# Patient Record
Sex: Female | Born: 1975 | Race: White | Hispanic: No | Marital: Married | Smoking: Former smoker
Health system: Southern US, Community
[De-identification: ages and names within clinical notes are randomized; demographics above are authoritative.]

## PROBLEM LIST (undated history)

## (undated) DIAGNOSIS — M419 Scoliosis, unspecified: Secondary | ICD-10-CM

## (undated) DIAGNOSIS — D649 Anemia, unspecified: Secondary | ICD-10-CM

## (undated) DIAGNOSIS — T7840XA Allergy, unspecified, initial encounter: Secondary | ICD-10-CM

## (undated) DIAGNOSIS — F419 Anxiety disorder, unspecified: Secondary | ICD-10-CM

## (undated) DIAGNOSIS — F909 Attention-deficit hyperactivity disorder, unspecified type: Secondary | ICD-10-CM

## (undated) DIAGNOSIS — B001 Herpesviral vesicular dermatitis: Secondary | ICD-10-CM

## (undated) DIAGNOSIS — U071 COVID-19: Secondary | ICD-10-CM

## (undated) DIAGNOSIS — R51 Headache: Secondary | ICD-10-CM

## (undated) HISTORY — DX: COVID-19: U07.1

## (undated) HISTORY — PX: APPENDECTOMY: SHX54

## (undated) HISTORY — DX: Headache: R51

## (undated) HISTORY — PX: DILATION AND CURETTAGE OF UTERUS: SHX78

## (undated) HISTORY — PX: TUBAL LIGATION: SHX77

## (undated) HISTORY — DX: Attention-deficit hyperactivity disorder, unspecified type: F90.9

## (undated) HISTORY — DX: Herpesviral vesicular dermatitis: B00.1

## (undated) HISTORY — DX: Allergy, unspecified, initial encounter: T78.40XA

## (undated) HISTORY — DX: Scoliosis, unspecified: M41.9

---

## 2005-05-15 ENCOUNTER — Observation Stay: Payer: Self-pay | Admitting: Obstetrics and Gynecology

## 2005-05-28 ENCOUNTER — Inpatient Hospital Stay: Payer: Self-pay | Admitting: Obstetrics and Gynecology

## 2005-05-28 ENCOUNTER — Observation Stay: Payer: Self-pay | Admitting: Obstetrics and Gynecology

## 2007-08-13 ENCOUNTER — Emergency Department: Payer: Self-pay | Admitting: Emergency Medicine

## 2007-08-15 ENCOUNTER — Emergency Department: Payer: Self-pay | Admitting: Emergency Medicine

## 2008-06-26 ENCOUNTER — Ambulatory Visit: Payer: Self-pay | Admitting: Internal Medicine

## 2009-02-17 HISTORY — PX: OTHER SURGICAL HISTORY: SHX169

## 2009-04-02 ENCOUNTER — Ambulatory Visit: Payer: Self-pay

## 2009-04-03 ENCOUNTER — Ambulatory Visit: Payer: Self-pay

## 2010-03-07 ENCOUNTER — Observation Stay: Payer: Self-pay

## 2010-03-25 LAB — HM PAP SMEAR: HM Pap smear: NORMAL

## 2010-07-15 ENCOUNTER — Ambulatory Visit: Payer: Self-pay

## 2010-07-16 ENCOUNTER — Inpatient Hospital Stay: Payer: Self-pay

## 2010-10-30 ENCOUNTER — Ambulatory Visit: Payer: Self-pay | Admitting: Internal Medicine

## 2010-11-29 ENCOUNTER — Ambulatory Visit: Payer: Self-pay | Admitting: Internal Medicine

## 2010-12-18 ENCOUNTER — Encounter: Payer: Self-pay | Admitting: Internal Medicine

## 2010-12-18 ENCOUNTER — Ambulatory Visit (INDEPENDENT_AMBULATORY_CARE_PROVIDER_SITE_OTHER): Payer: Managed Care, Other (non HMO) | Admitting: Internal Medicine

## 2010-12-18 VITALS — BP 102/76 | HR 86 | Temp 98.0°F | Resp 20 | Ht 66.0 in | Wt 152.0 lb

## 2010-12-18 DIAGNOSIS — F53 Postpartum depression: Secondary | ICD-10-CM

## 2010-12-18 DIAGNOSIS — E663 Overweight: Secondary | ICD-10-CM

## 2010-12-18 DIAGNOSIS — IMO0002 Reserved for concepts with insufficient information to code with codable children: Secondary | ICD-10-CM

## 2010-12-18 MED ORDER — PHENTERMINE HCL 37.5 MG PO CAPS: 37.5000 mg | ORAL_CAPSULE | ORAL | Status: DC

## 2010-12-18 MED ORDER — CITALOPRAM HYDROBROMIDE 10 MG PO TABS: 10.0000 mg | ORAL_TABLET | Freq: Every day | ORAL | Status: DC

## 2010-12-19 DIAGNOSIS — O99345 Other mental disorders complicating the puerperium: Secondary | ICD-10-CM | POA: Insufficient documentation

## 2010-12-19 NOTE — Progress Notes (Signed)
Subjective:    Patient ID: Hannah Mason, female    DOB: [birth year], 35 y.o.   MRN: [id number]  HPI  35 year old female presents to establish care. She notes that 5 months ago she delivered a healthy baby girl. She reports that since that time she has noticed some increased anxiety and finds herself quit to get angry with her family members. She has also had some dysphoric mood, however reports that his recently improved. She notes that her sleep is very limited in caring for her child. She also has 3 other children at home. She has tried to establish working relationship with her husband to help divide chores. She took an online quiz regarding postpartum depression which showed that she does suffer from symptoms consistent with this. She would like to consider starting medication to help improve her mood and coping ability.  She notes that she was recently started by another physician on a stimulant medication to help with appetite suppression. She gained 35 pounds during her pregnancy and has been able to lose about 20 pounds so far. She has been following a healthy diet. Her ability to exercise has been limited by time constraints. She is interested in continuing medication for appetite suppression until she can get to her goal weight of 140 pounds, which was her prepregnancy weight. She denies any symptoms to suggest thyroid dysfunction such as constipation, hot or cold intolerance, dry skin.  Review of Systems  Constitutional: Negative for fever, chills, appetite change, fatigue and unexpected weight change.  HENT: Negative for ear pain, congestion, sore throat, trouble swallowing, neck pain, voice change and sinus pressure.   Eyes: Negative for visual disturbance.  Respiratory: Negative for cough, shortness of breath, wheezing and stridor.   Cardiovascular: Negative for chest pain, palpitations and leg swelling.  Gastrointestinal: Negative for nausea, vomiting, abdominal pain, diarrhea,  constipation, blood in stool, abdominal distention and anal bleeding.  Genitourinary: Negative for dysuria and flank pain.  Musculoskeletal: Negative for myalgias, arthralgias and gait problem.  Skin: Negative for color change and rash.  Neurological: Negative for dizziness and headaches.  Hematological: Negative for adenopathy. Does not bruise/bleed easily.  Psychiatric/Behavioral: Negative for suicidal ideas, sleep disturbance and dysphoric mood. The patient is nervous/anxious.        Objective:   Physical Exam  Constitutional: She is oriented to person, place, and time. She appears well-developed and well-nourished. No distress.  HENT:  Head: Normocephalic and atraumatic.  Right Ear: External ear normal.  Left Ear: External ear normal.  Nose: Nose normal.  Mouth/Throat: Oropharynx is clear and moist. No oropharyngeal exudate.  Eyes: Conjunctivae are normal. Pupils are equal, round, and reactive to light. Right eye exhibits no discharge. Left eye exhibits no discharge. No scleral icterus.  Neck: Normal range of motion. Neck supple. No tracheal deviation present. No thyromegaly present.  Cardiovascular: Normal rate, regular rhythm, normal heart sounds and intact distal pulses.  Exam reveals no gallop and no friction rub.   No murmur heard. Pulmonary/Chest: Effort normal and breath sounds normal. No respiratory distress. She has no wheezes. She has no rales. She exhibits no tenderness.  Abdominal: Soft. Bowel sounds are normal. She exhibits no distension and no mass. There is no tenderness. There is no rebound and no guarding.  Musculoskeletal: Normal range of motion. She exhibits no edema and no tenderness.  Lymphadenopathy:    She has no cervical adenopathy.  Neurological: She is alert and oriented to person, place, and time. No cranial nerve deficit. She exhibits  normal muscle tone. Coordination normal.  Skin: Skin is warm and dry. No rash noted. She is not diaphoretic. No erythema.  No pallor.  Psychiatric: She has a normal mood and affect. Her behavior is normal. Judgment and thought content normal.          Assessment & Plan:  1. Post Partum Depression -will try a course of Celexa. We discussed that this will take several weeks to work. We discussed the potential benefit of improved coping and decreased anxiety and dysphoric mood. She will call if she has any complications at this medication. Otherwise, she will followup in one month.  2. Overweight -patient with BMI at 25. Will continue phentermine for one month with improve efforts at diet and regular physical activity and goal of reaching prepregnancy weight of 140 and normal BMI. We discussed the risk of stimulant medications including increased blood pressure and palpitations. We also discussed the potential risk for increased agitation. She will call or return to clinic should any of these occur. Otherwise she will followup in one month.

## 2011-01-20 ENCOUNTER — Ambulatory Visit (INDEPENDENT_AMBULATORY_CARE_PROVIDER_SITE_OTHER): Payer: Managed Care, Other (non HMO) | Admitting: Internal Medicine

## 2011-01-20 ENCOUNTER — Encounter: Payer: Self-pay | Admitting: Internal Medicine

## 2011-01-20 VITALS — BP 116/62 | HR 85 | Temp 98.4°F | Wt 150.0 lb

## 2011-01-20 DIAGNOSIS — E663 Overweight: Secondary | ICD-10-CM

## 2011-01-20 DIAGNOSIS — F53 Postpartum depression: Secondary | ICD-10-CM

## 2011-01-20 DIAGNOSIS — F3289 Other specified depressive episodes: Secondary | ICD-10-CM

## 2011-01-20 DIAGNOSIS — F329 Major depressive disorder, single episode, unspecified: Secondary | ICD-10-CM

## 2011-01-20 DIAGNOSIS — IMO0002 Reserved for concepts with insufficient information to code with codable children: Secondary | ICD-10-CM

## 2011-01-20 MED ORDER — CITALOPRAM HYDROBROMIDE 10 MG PO TABS: 10.0000 mg | ORAL_TABLET | Freq: Every day | ORAL | Status: DC

## 2011-01-20 NOTE — Patient Instructions (Signed)
[facility] - Book and Blog

## 2011-01-20 NOTE — Progress Notes (Signed)
Subjective:    Patient ID: Hannah Mason, female    DOB: 1976-01-08, 35 y.o.   MRN: [id number]  HPI  35 year old female with history of postpartum depression and being overweight presents for followup. In regards to her depression she reports she has done well. She reports her mood is much improved using Celexa. She has not noted any side effects of this medication. She denies any sleep disturbance or ongoing depression or anxiety.  In regards to her weight, she continues to use phentermine. She has lost another 3 pounds. She denies any chest pain or palpitations. She reports that her diet often consist of fast food however calories are limited. She is making efforts to increase her physical activity.  Outpatient Encounter Prescriptions as of 01/20/2011  Medication Sig Dispense Refill  . citalopram (CELEXA) 10 MG tablet Take 1 tablet (10 mg total) by mouth daily.  90 tablet  3  . phentermine 37.5 MG capsule Take 37.5 mg by mouth every morning.        Marland Kitchen DISCONTD: citalopram (CELEXA) 10 MG tablet Take 1 tablet (10 mg total) by mouth daily.  30 tablet  3    Review of Systems  Constitutional: Negative for fever, chills, appetite change, fatigue and unexpected weight change.  Eyes: Negative for visual disturbance.  Cardiovascular: Negative for chest pain and palpitations.  Genitourinary: Negative for dysuria and flank pain.  Neurological: Negative for dizziness and headaches.  Hematological: Negative for adenopathy. Does not bruise/bleed easily.  Psychiatric/Behavioral: Negative for suicidal ideas, sleep disturbance and dysphoric mood. The patient is not nervous/anxious.    BP 116/62  Pulse 85  Temp(Src) 98.4 F (36.9 C) (Oral)  Wt 150 lb (68.04 kg)  SpO2 97%     Objective:   Physical Exam  Constitutional: She is oriented to person, place, and time. She appears well-developed and well-nourished. No distress.  HENT:  Head: Normocephalic and atraumatic.  Right Ear: External ear normal.    Left Ear: External ear normal.  Nose: Nose normal.  Mouth/Throat: Oropharynx is clear and moist. No oropharyngeal exudate.  Eyes: Conjunctivae are normal. Pupils are equal, round, and reactive to light. Right eye exhibits no discharge. Left eye exhibits no discharge. No scleral icterus.  Neck: Normal range of motion. Neck supple. No tracheal deviation present. No thyromegaly present.  Cardiovascular: Normal rate, regular rhythm, normal heart sounds and intact distal pulses.  Exam reveals no gallop and no friction rub.   No murmur heard. Pulmonary/Chest: Effort normal and breath sounds normal. No respiratory distress. She has no wheezes. She has no rales. She exhibits no tenderness.  Musculoskeletal: Normal range of motion. She exhibits no edema and no tenderness.  Lymphadenopathy:    She has no cervical adenopathy.  Neurological: She is alert and oriented to person, place, and time. No cranial nerve deficit. She exhibits normal muscle tone. Coordination normal.  Skin: Skin is warm and dry. No rash noted. She is not diaphoretic. No erythema. No pallor.  Psychiatric: She has a normal mood and affect. Her behavior is normal. Judgment and thought content normal.          Assessment & Plan:  1. Depression - much improved with use of Celexa. We'll continue. Patient will followup in 6 months or earlier if needed.  2. Weight loss - BMI is now in the normal range. Patient has lost another 3 pounds since her last visit. Encouraged her to gradually discontinue phentermine. Encouraged her to continue with healthy diet and regular physical activity.  Will followup in 6 months and as needed.

## 2011-01-21 ENCOUNTER — Telehealth: Payer: Self-pay | Admitting: *Deleted

## 2011-01-21 LAB — CBC WITH DIFFERENTIAL/PLATELET
Basophils Absolute: 0 10*3/uL (ref 0.0–0.1)
Basophils Relative: 0 % (ref 0–1)
Eosinophils Absolute: 0.4 10*3/uL (ref 0.0–0.7)
Eosinophils Relative: 3 % (ref 0–5)
HCT: 37.5 % (ref 36.0–46.0)
MCH: 27.2 pg (ref 26.0–34.0)
MCHC: 31.7 g/dL (ref 30.0–36.0)
MCV: 85.8 fL (ref 78.0–100.0)
Monocytes Absolute: 0.9 10*3/uL (ref 0.1–1.0)
Platelets: 288 10*3/uL (ref 150–400)
RDW: 15 % (ref 11.5–15.5)

## 2011-01-21 LAB — COMPREHENSIVE METABOLIC PANEL
BUN: 16 mg/dL (ref 6–23)
CO2: 21 mEq/L (ref 19–32)
Calcium: 6 mg/dL — ABNORMAL LOW (ref 8.4–10.5)
Chloride: 109 mEq/L (ref 96–112)
Creatinine, Ser: 0.8 mg/dL (ref 0.4–1.2)
GFR: 86.89 mL/min (ref >60.00)
Total Bilirubin: 0.2 mg/dL — ABNORMAL LOW (ref 0.3–1.2)

## 2011-01-21 LAB — LIPID PANEL
HDL: 47.3 mg/dL (ref >39.00)
Triglycerides: 58 mg/dL (ref 0.0–149.0)
VLDL: 11.6 mg/dL (ref 0.0–40.0)

## 2011-01-21 NOTE — Telephone Encounter (Signed)
I suspect this is related to hemolysis, but we should repeat BMP tomorrow.

## 2011-01-21 NOTE — Telephone Encounter (Signed)
[facility] called to give critical on patient's potassium of 6.2. Labs are in chart.

## 2011-01-22 ENCOUNTER — Other Ambulatory Visit (INDEPENDENT_AMBULATORY_CARE_PROVIDER_SITE_OTHER): Payer: Managed Care, Other (non HMO) | Admitting: *Deleted

## 2011-01-22 DIAGNOSIS — I10 Essential (primary) hypertension: Secondary | ICD-10-CM

## 2011-01-22 LAB — BASIC METABOLIC PANEL
BUN: 14 mg/dL (ref 6–23)
Calcium: 8.6 mg/dL (ref 8.4–10.5)
Chloride: 109 mEq/L (ref 96–112)
Creatinine, Ser: 0.8 mg/dL (ref 0.4–1.2)
GFR: 90.8 mL/min (ref >60.00)

## 2011-01-22 NOTE — Telephone Encounter (Signed)
Left detailed vm for pt to call and schedule lab apt.

## 2011-01-22 NOTE — Telephone Encounter (Signed)
Pt has apt at 2 today for labs

## 2011-01-23 ENCOUNTER — Ambulatory Visit (INDEPENDENT_AMBULATORY_CARE_PROVIDER_SITE_OTHER): Payer: Managed Care, Other (non HMO) | Admitting: Internal Medicine

## 2011-01-23 ENCOUNTER — Encounter: Payer: Self-pay | Admitting: Internal Medicine

## 2011-01-23 VITALS — BP 112/62 | HR 112 | Temp 99.1°F | Wt 152.0 lb

## 2011-01-23 DIAGNOSIS — J40 Bronchitis, not specified as acute or chronic: Secondary | ICD-10-CM

## 2011-01-23 MED ORDER — GUAIFENESIN-CODEINE 100-10 MG/5ML PO SYRP: 5.0000 mL | ORAL_SOLUTION | Freq: Two times a day (BID) | ORAL | Status: AC | PRN

## 2011-01-23 MED ORDER — PREDNISONE (PAK) 10 MG PO TABS: ORAL_TABLET | ORAL | Status: AC

## 2011-01-23 MED ORDER — ALBUTEROL SULFATE HFA 108 (90 BASE) MCG/ACT IN AERS: 2.0000 | INHALATION_SPRAY | Freq: Four times a day (QID) | RESPIRATORY_TRACT | Status: DC | PRN

## 2011-01-23 MED ORDER — AZITHROMYCIN 250 MG PO TABS: ORAL_TABLET | ORAL | Status: AC

## 2011-01-23 NOTE — Patient Instructions (Signed)
Start antibiotics and prednisone.  Use albuterol and cough medication as needed.  Follow up in 1 week.

## 2011-01-23 NOTE — Progress Notes (Signed)
Subjective:    Patient ID: Hannah Mason, female    DOB: [birth year], 35 y.o.   MRN: [id number]  Cough This is a new problem. The current episode started in the past 7 days. The problem has been gradually worsening. The problem occurs every few minutes. The cough is productive of purulent sputum. Associated symptoms include chills, ear congestion, ear pain, a fever, headaches, nasal congestion, postnasal drip, rhinorrhea and a sore throat. Pertinent negatives include no chest pain, shortness of breath or wheezing. She has tried rest and OTC inhaler for the symptoms. The treatment provided no relief.  Fever  This is a new problem. The current episode started in the past 7 days. The problem occurs constantly. The problem has been unchanged. The maximum temperature noted was 100 to 100.9 F. The temperature was taken using an oral thermometer. Associated symptoms include congestion, coughing, ear pain, headaches and a sore throat. Pertinent negatives include no abdominal pain, chest pain, muscle aches, vomiting or wheezing. She has tried NSAIDs for the symptoms. The treatment provided mild relief.  Sinusitis This is a new problem. The current episode started in the past 7 days. The problem has been gradually worsening since onset. The maximum temperature recorded prior to her arrival was 100 - 100.9 F. The fever has been present for 3 to 4 days. The pain is moderate. Associated symptoms include chills, congestion, coughing, ear pain, headaches and a sore throat. Pertinent negatives include no shortness of breath. Past treatments include nothing.    Outpatient Encounter Prescriptions as of 01/23/2011  Medication Sig Dispense Refill  . citalopram (CELEXA) 10 MG tablet Take 1 tablet (10 mg total) by mouth daily.  90 tablet  3  . phentermine 37.5 MG capsule Take 37.5 mg by mouth every morning.           Review of Systems  Constitutional: Positive for fever and chills.  HENT: Positive for ear pain,  congestion, sore throat, rhinorrhea and postnasal drip.   Respiratory: Positive for cough. Negative for shortness of breath and wheezing.   Cardiovascular: Negative for chest pain.  Gastrointestinal: Negative for vomiting and abdominal pain.  Neurological: Positive for headaches.   BP 112/62  Pulse 112  Temp(Src) 99.1 F (37.3 C) (Oral)  Wt 152 lb (68.947 kg)  SpO2 98%     Objective:   Physical Exam  Constitutional: She is oriented to person, place, and time. She appears well-developed and well-nourished. No distress.  HENT:  Head: Normocephalic and atraumatic.  Right Ear: External ear normal. Tympanic membrane is bulging. A middle ear effusion is present.  Left Ear: External ear normal. Tympanic membrane is bulging. A middle ear effusion is present.  Nose: Nose normal.  Mouth/Throat: Oropharynx is clear and moist. No oropharyngeal exudate.  Eyes: Conjunctivae are normal. Pupils are equal, round, and reactive to light. Right eye exhibits no discharge. Left eye exhibits no discharge. No scleral icterus.  Neck: Normal range of motion. Neck supple. No tracheal deviation present. No thyromegaly present.  Cardiovascular: Normal rate, regular rhythm, normal heart sounds and intact distal pulses.  Exam reveals no gallop and no friction rub.   No murmur heard. Pulmonary/Chest: Effort normal. No respiratory distress. She has decreased breath sounds. She has no wheezes. She has rhonchi. She has no rales. She exhibits no tenderness.  Musculoskeletal: Normal range of motion. She exhibits no edema and no tenderness.  Lymphadenopathy:    She has no cervical adenopathy.  Neurological: She is alert and oriented to person, place,  and time. No cranial nerve deficit. She exhibits normal muscle tone. Coordination normal.  Skin: Skin is warm and dry. No rash noted. She is not diaphoretic. No erythema. No pallor.  Psychiatric: She has a normal mood and affect. Her behavior is normal. Judgment and thought  content normal.          Assessment & Plan:  1j. Bronchitis - will treat with prednisone taper pack and azithromycin. Patient will use guaifenesin and codeine for cough. She will use albuterol for cough and shortness of breath. She will call if no improvement over the next 24-48 hours.

## 2011-01-30 ENCOUNTER — Ambulatory Visit: Payer: Managed Care, Other (non HMO) | Admitting: Internal Medicine

## 2011-11-15 ENCOUNTER — Inpatient Hospital Stay: Payer: Self-pay | Admitting: Surgery

## 2011-11-15 LAB — COMPREHENSIVE METABOLIC PANEL
Alkaline Phosphatase: 79 U/L (ref 50–136)
Calcium, Total: 9.1 mg/dL (ref 8.5–10.1)
Co2: 24 mmol/L (ref 21–32)
EGFR (Non-African Amer.): >60
Osmolality: 277 (ref 275–301)
SGPT (ALT): 16 U/L (ref 12–78)

## 2011-11-15 LAB — URINALYSIS, COMPLETE
Bilirubin,UR: NEGATIVE
Leukocyte Esterase: NEGATIVE
Protein: NEGATIVE
RBC,UR: 1 /HPF (ref 0–5)
Squamous Epithelial: 2
WBC UR: 1 /HPF (ref 0–5)

## 2011-11-15 LAB — DIFFERENTIAL
Basophil #: 0.1 10*3/uL (ref 0.0–0.1)
Eosinophil #: 0 10*3/uL (ref 0.0–0.7)
Lymphocyte %: 3 %
Neutrophil #: 14.9 10*3/uL — ABNORMAL HIGH (ref 1.4–6.5)
Neutrophil %: 88.5 %

## 2011-11-15 LAB — PREGNANCY, URINE: Pregnancy Test, Urine: NEGATIVE m[IU]/mL

## 2011-11-15 LAB — CBC
MCH: 28.9 pg (ref 26.0–34.0)
MCV: 87 fL (ref 80–100)

## 2011-11-19 LAB — PATHOLOGY REPORT

## 2011-12-04 ENCOUNTER — Ambulatory Visit: Payer: Managed Care, Other (non HMO) | Admitting: Internal Medicine

## 2011-12-08 ENCOUNTER — Ambulatory Visit: Payer: Managed Care, Other (non HMO) | Admitting: Internal Medicine

## 2011-12-22 ENCOUNTER — Ambulatory Visit: Payer: Managed Care, Other (non HMO) | Admitting: Internal Medicine

## 2014-06-06 NOTE — Op Note (Signed)
PATIENT NAME:  Hannah Mason, Hannah Mason MR#:  [id number] DATE OF BIRTH:  1976/02/04  DATE OF PROCEDURE:  11/15/2011  PREOPERATIVE DIAGNOSIS: Acute appendicitis.   POSTOPERATIVE DIAGNOSIS: Acute appendicitis.   PROCEDURE PERFORMED: Laparoscopic appendectomy.   SURGEON: Quentin Orealph Mason. Ely, III, MD    ANESTHESIA: General.   OPERATIVE PROCEDURE: With the patient in the supine position after induction of appropriate general anesthesia, the patient's abdomen was prepped with ChloraPrep and draped with sterile towels. The patient was placed in the head down, feet up position. A small infraumbilical incision was made in the standard fashion and carried down bluntly through the subcutaneous tissue. The Veress needle was used to cannulate the peritoneal cavity. CO2 was insufflated to appropriate pressure measurements. When approximately 2.5 liters of CO2 were instilled, the Veress needle was withdrawn. An 11 mm [employer] Port was inserted into the peritoneal cavity. Position was confirmed and CO2 was reinsufflated. A transverse mid epigastric incision was made and an 11 mm port was inserted under direct vision. The right lower quadrant was investigated. There was a large inflamed suppurative near necrotic appendix identified which was thickened. There was no obvious rupture. The left ovary appeared to be quite cystic in nature with some irregularity. Pictures were taken. The right ovary appeared to be smoother with a very small cyst noted. A left lower quadrant incision was made in the 12 mm port inserted under direct vision. The camera was moved to the upper port and dissection carried out through the two lower ports. The mesoappendix was divided with two applications of the Endo GIA stapling device carrying a white load. The base of the appendix was divided with a single application of the Endo GIA stapler carrying a blue load. The appendix was captured in an EndoCatch apparatus and removed. The abdomen was then  irrigated with 2 liters of warm saline solution. The left lower quadrant incision was closed using a suture passer and 0 Vicryl in a figure-of-eight suture. The abdomen was then desufflated and all ports were withdrawn without difficulty. Skin incisions were closed with 5-0 nylon. The area was infiltrated with 0.25% Marcaine for postoperative pain control. Sterile dressings were applied.   The patient was returned to the recovery room having tolerated the procedure well. Sponge, instrument, and needle counts were correct x2 in the Operating Room.    ____________________________ Hannah Endalph Mason. Ely III, MD rle:cbb D: 11/15/2011 16:02:26 ET T: 11/15/2011 16:08:25 ET JOB#: [id number]  cc: Hannah Endalph Mason. Ely III, MD, <Dictator> Silas FloodSheikh A. Ellsworth Lennoxejan-Sie, MD Quentin OreALPH Mason ELY MD ELECTRONICALLY SIGNED 11/16/2011 16:26

## 2014-06-06 NOTE — H&P (Signed)
PATIENT NAME:  Hannah Mason, Kamrie L MR#:  [id number] DATE OF BIRTH:  02-14-1976  DATE OF ADMISSION:  11/15/2011  ADMITTING PHYSICIAN:  Quentin Orealph L. Ely, III, MD   PRIMARY CARE PHYSICIAN: Bluford MainSheikh Tejan-Sie, MD   CHIEF COMPLAINT: Abdominal pain, nausea, and diarrhea.   BRIEF HISTORY: Hannah Mason is a 39 year old woman seen in the Emergency Room with a 48-hour history of abdominal pain. She was in her usual state of good health until she began to notice some midepigastric periumbilical abdominal pain which persisted over the last 48 hours. She awoke from sleep early this morning with severe pain in the midepigastric periumbilical area associated with mild fever and some diarrhea. The pain was so intense that she was crying. She came to the Emergency Room for further evaluation. She described the pain as a sharp, stabbing, constant periumbilical pain. Work-up in the Emergency Room revealed an elevated white blood cell count. CT scan was performed which demonstrated a thickened, edematous appendix with significant periappendiceal inflammatory changes and a fecalith. The Surgical Service was consulted.   PAST MEDICAL/SURGICAL HISTORY: She denies any previous similar symptoms in the past. She has not had any other major abdominal problems. She denies any history of hepatitis, yellow jaundice, pancreatitis, peptic ulcer disease, gallbladder disease, or diverticulitis. Her only previous surgery has been a cesarean section. She does not have any history of cardiac disease, hypertension, thyroid disease, or diabetes.   MEDICATIONS: She takes no medications regularly.   ALLERGIES: She has no medical allergies.   SOCIAL HISTORY: She is not a cigarette smoker. She drinks alcohol occasionally.   REVIEW OF SYSTEMS: A 10-point review of systems was carried out and is unremarkable.   FAMILY HISTORY: Noncontributory.   PHYSICAL EXAMINATION:  GENERAL: She is obviously sedated and uncomfortable, complaining of marked  abdominal discomfort.   VITAL SIGNS: Blood pressure 144/88. Heart rate is 92 and regular. Oxygen saturation is normal.   HEENT: Exam reveals no scleral icterus. She has normal pupillary reactions.  She has no facial deformity.   NECK: Supple without adenopathy. Trachea is midline.   CHEST: Clear with no adventitious sounds. She seems to be in normal sinus rhythm.   ABDOMEN: Her abdomen is soft but has generalized tenderness primarily in the midepigastric and periumbilical area with some mild suprapubic tenderness. She has generalized rebound and guarding. She has been sedated and will not localize her pain. She has hypoactive bowel sounds.   EXTREMITIES: Full range of motion, no deformities and good distal pulses.   PSYCHIATRIC: Normal orientation, normal affect.   IMPRESSION/PLAN: This young woman presents with symptoms consistent with acute appendicitis and a CT scan which I have independently reviewed which is consistent with acute appendicitis. We will plan to admit her to the hospital and take her to the operating room this afternoon with intention of performing laparoscopy, possible appendectomy. This plan has been discussed with the patient in detail, and she is in agreement. The family was present for the interview.  ____________________________ Carmie Endalph L. Ely III, MD rle:cbb D: 11/15/2011 13:35:31 ET T: 11/15/2011 13:59:47 ET JOB#: [id number]  cc: Carmie Endalph L. Ely III, MD, <Dictator> Silas FloodSheikh A. Ellsworth Lennoxejan-Sie, MD Quentin OreALPH L ELY MD ELECTRONICALLY SIGNED 11/16/2011 16:26

## 2014-10-16 ENCOUNTER — Telehealth: Payer: Self-pay | Admitting: Obstetrics and Gynecology

## 2014-10-16 MED ORDER — FLUCONAZOLE 150 MG PO TABS: 150.0000 mg | ORAL_TABLET | Freq: Every day | ORAL | Status: DC

## 2014-10-16 NOTE — Telephone Encounter (Signed)
Pt aware diflucan erx. Pt advised to make AE appt in the next 4 weeks.

## 2014-10-16 NOTE — Telephone Encounter (Signed)
Pt stated that she normally gets a yeast infection once a year, and she stated she normally calls in and has a refill sent, she uses [facility] in [place].

## 2014-11-22 ENCOUNTER — Other Ambulatory Visit: Payer: Self-pay | Admitting: Obstetrics and Gynecology

## 2015-04-25 ENCOUNTER — Other Ambulatory Visit: Payer: Self-pay | Admitting: Obstetrics and Gynecology

## 2015-07-03 ENCOUNTER — Other Ambulatory Visit: Payer: Self-pay | Admitting: Obstetrics and Gynecology

## 2015-11-07 ENCOUNTER — Encounter: Payer: Self-pay | Admitting: Obstetrics and Gynecology

## 2015-12-03 NOTE — Progress Notes (Signed)
ANNUAL PREVENTATIVE CARE GYN  ENCOUNTER NOTE  Subjective:       Hannah Mason is a 40 y.o. No obstetric history on file. female here for a routine annual gynecologic exam.  Current complaints: 1.   Breast tenderness at times  Cycles are light with the IUD in place. She missed menses this past month.   Gynecologic History No LMP recorded. Contraception: IUD- mirena inserted 11/16/2013 Last Pap: 2011 wnl at [facility]. Results were: normal Last mammogram: n/a Results were: normal  Obstetric History OB History  No data available    Past Medical History:  Diagnosis Date  . Allergy    seasonal  . Headache    frequent  . Scoliosis     Past Surgical History:  Procedure Laterality Date  . CESAREAN SECTION     1  . DILATION AND CURETT[facility]US    [facility]   Miscarrigage in 2011  . TUBAL LIGATION      Current Outpatient Prescriptions on File Prior to Visit  Medication Sig Dispense Refill  . acyclovir (ZOVIRAX) 400 MG tablet TAKE 1 TABLET BY MOUTH THREE TIMES DAILY 15 tablet 0  . albuterol (PROAIR HFA) 108 (90 BASE) MCG/ACT inhaler Inhale 2 puffs into the lungs every 6 (six) hours as needed for wheezing. 18 g 6  . fluconazole (DIFLUCAN) 150 MG tablet Take 1 tablet (150 mg total) by mouth daily. 1 tablet 0  . phentermine 37.5 MG capsule Take 1 capsule (37.5 mg total) by mouth every morning. 30 capsule 0  . phentermine 37.5 MG capsule Take 37.5 mg by mouth every morning.       No current facility-administered medications on file prior to visit.     No Known Allergies  Social History   Social History  . Marital status: Married    Spouse name: N/A  . Number of children: N/A  . Years of education: N/A   Occupational History  . Not on file.   Social History Main Topics  . Smoking status: Former Smoker    Quit date: 12/17/2005  . Smokeless tobacco: Never Used  . Alcohol use No  . Drug use: No  . Sexual activity: Not on file   Other Topics Concern  . Not on file    Social History Narrative   Lives [place] in [place], 4 children in home (14,12,5,565months). No pets. Work Engineer, [employer].    Family History  Problem Relation Age of Onset  . Diabetes Father   . Hypertension Father     The following portions of the patient's history were reviewed and updated as appropriate: allergies, current medications, past family history, past medical history, past social history, past surgical history and problem list.  Review of Systems ROS Review of Systems - General ROS: negative for - chills, fatigue, fever, hot flashes, night sweats, weight gain or weight loss Psychological ROS: negative for - anxiety, decreased libido, depression, mood swings, physical abuse or sexual abuse Ophthalmic ROS: negative for - blurry vision, eye pain or loss of vision ENT ROS: negative for - headaches, hearing change, visual changes or vocal changes Allergy and Immunology ROS: negative for - hives, itchy/watery eyes or seasonal allergies Hematological and Lymphatic ROS: negative for - bleeding problems, bruising, swollen lymph nodes or weight loss Endocrine ROS: negative for - galactorrhea, hair pattern changes, hot flashes, malaise/lethargy, mood swings, palpitations, polydipsia/polyuria, skin changes, temperature intolerance or unexpected weight changes Breast ROS: negative for - new or changing breast lumps or  nipple discharge Respiratory ROS: negative for - cough or shortness of breath Cardiovascular ROS: negative for - chest pain, irregular heartbeat, palpitations or shortness of breath Gastrointestinal ROS: no abdominal pain, change in bowel habits, or black or bloody stools Genito-Urinary ROS: no dysuria, trouble voiding, or hematuria Musculoskeletal ROS: negative for - joint pain or joint stiffness Neurological ROS: negative for - bowel and bladder control changes Dermatological ROS: negative for rash and skin lesion changes   Objective:  BP 115/75   Pulse 69   Ht 5\' 7"   (1.702 m)   Wt 144 lb 4.8 oz (65.5 kg)   LMP 10/28/2015 (Exact Date)   BMI 22.60 kg/m  CONSTITUTIONAL: Well-developed, well-nourished female in no acute distress.  PSYCHIATRIC: Normal mood and affect. Normal behavior. Normal judgment and thought content. NEUROLGIC: Alert and oriented to person, place, and time. Normal muscle tone coordination. No cranial nerve deficit noted. HENT:  Normocephalic, atraumatic, External right and left ear normal.  EYES: Conjunctivae and EOM are normal.  No scleral icterus.  NECK: Normal range of motion, supple, no masses.  Normal thyroid.  SKIN: Skin is warm and dry. No rash noted. Not diaphoretic. No erythema. No pallor. CARDIOVASCULAR: Normal heart rate noted, regular rhythm, no murmur. RESPIRATORY: Clear to auscultation bilaterally. Effort and breath sounds normal, no problems with respiration noted. BREASTS: Symmetric in size. No masses, skin changes, nipple drainage, or lymphadenopathy. ABDOMEN: Soft, normal bowel sounds, no distention noted.  No tenderness, rebound or guarding.  BLADDER: Normal PELVIC:  External Genitalia: Normal  BUS: Normal  Vagina: Normal  Cervix: Normal; IUD string not visualized; no cervical motion tenderness  Uterus: Normal; midplane, normal size and shape, mobile, nontender  Adnexa: Normal; nonpalpable and nontender  RV: External Exam NormaI  MUSCULOSKELETAL: Normal range of motion. No tenderness.  No cyanosis, clubbing, or edema.  2+ distal pulses. LYMPHATIC: No Axillary, Supraclavicular, or Inguinal Adenopathy.    Assessment:   Annual gynecologic examination 40 y.o. Contraception: IUD; BTL Normal BMI Problem List Items Addressed This Visit    None    Visit Diagnoses    Well woman exam with routine gynecological exam    -  Primary    Mastodynia Nonvisualization of IUD string  Plan:  Pap: Pap Co Test Mammogram: due age 40 Stool Guaiac Testing:  Not Indicated Labs: Lipid 1, FBS, TSH, Hemoglobin A1C and Vit D  Level"". Routine preventative health maintenance measures emphasized: Exercise/Diet/Weight control, Tobacco Warnings and Alcohol/Substance use risks If menses become heavier, consider returning for ultrasound to confirm IUD presence and location Decrease caffeine and chocolate intake Return to Clinic - [address] [place], CMA  Herold Harms, MD  Note: This dictation was prepared with Dragon dictation along with smaller phrase technology. Any transcriptional errors that result from this process are unintentional.

## 2015-12-05 ENCOUNTER — Encounter: Payer: Self-pay | Admitting: Obstetrics and Gynecology

## 2015-12-05 ENCOUNTER — Ambulatory Visit (INDEPENDENT_AMBULATORY_CARE_PROVIDER_SITE_OTHER): Payer: Managed Care, Other (non HMO) | Admitting: Obstetrics and Gynecology

## 2015-12-05 VITALS — BP 115/75 | HR 69 | Ht 67.0 in | Wt 144.3 lb

## 2015-12-05 DIAGNOSIS — Z01419 Encounter for gynecological examination (general) (routine) without abnormal findings: Secondary | ICD-10-CM

## 2015-12-05 DIAGNOSIS — B009 Herpesviral infection, unspecified: Secondary | ICD-10-CM | POA: Diagnosis not present

## 2015-12-05 DIAGNOSIS — N644 Mastodynia: Secondary | ICD-10-CM

## 2015-12-05 DIAGNOSIS — Z30431 Encounter for routine checking of intrauterine contraceptive device: Secondary | ICD-10-CM | POA: Insufficient documentation

## 2015-12-05 DIAGNOSIS — Z1231 Encounter for screening mammogram for malignant neoplasm of breast: Secondary | ICD-10-CM

## 2015-12-05 DIAGNOSIS — Z1239 Encounter for other screening for malignant neoplasm of breast: Secondary | ICD-10-CM

## 2015-12-05 MED ORDER — ACYCLOVIR 400 MG PO TABS: 400.0000 mg | ORAL_TABLET | Freq: Three times a day (TID) | ORAL | 4 refills | Status: DC

## 2015-12-05 NOTE — Patient Instructions (Signed)
1. Pap smear is done 2. Mammogram is ordered 3. IUD string is not visualized today: If menses change for the worse, please contact us 4. Contraception-tubal ligation 5. Continue with healthy eating and exercise 6. Decrease caffeinated beverages and chocolate intake to help reduce breast pain 7. Screening labs are ordered 8. Return in 1 year

## 2015-12-10 LAB — PAP IG AND HPV HIGH-RISK
HPV, high-risk: NEGATIVE
PAP Smear Comment: 0

## 2016-03-10 ENCOUNTER — Ambulatory Visit (INDEPENDENT_AMBULATORY_CARE_PROVIDER_SITE_OTHER): Payer: Managed Care, Other (non HMO)

## 2016-03-10 ENCOUNTER — Encounter: Payer: Self-pay | Admitting: Family

## 2016-03-10 ENCOUNTER — Ambulatory Visit (INDEPENDENT_AMBULATORY_CARE_PROVIDER_SITE_OTHER): Payer: Managed Care, Other (non HMO) | Admitting: Family

## 2016-03-10 ENCOUNTER — Telehealth: Payer: Self-pay | Admitting: Obstetrics and Gynecology

## 2016-03-10 VITALS — BP 98/68 | HR 81 | Temp 97.7°F | Ht 67.0 in | Wt 145.0 lb

## 2016-03-10 DIAGNOSIS — M25521 Pain in right elbow: Secondary | ICD-10-CM

## 2016-03-10 DIAGNOSIS — B070 Plantar wart: Secondary | ICD-10-CM

## 2016-03-10 NOTE — Patient Instructions (Signed)
As discussed, we'll start with x-rays of elbow.  Please continue taking easy with weight lifting, exercises. Referral placed dermatology for annual skin check as well as wart removal Pleasure meeting you

## 2016-03-10 NOTE — Progress Notes (Signed)
Pre visit review using our clinic review tool, if applicable. No additional management support is needed unless otherwise documented below in the visit note. 

## 2016-03-10 NOTE — Assessment & Plan Note (Addendum)
Pending XR to ensure no fracture, dislocation. Also considering small effusion as unable to fully extend. Advised no weights with exercise, heat/ice, and neoprene sleeve.

## 2016-03-10 NOTE — Telephone Encounter (Signed)
Pt was supposed to have blood drawn over 2 mo ago, she didn't go

## 2016-03-10 NOTE — Assessment & Plan Note (Signed)
Failed conservative treatment at home. Referral dermatology for cryotherapy.

## 2016-03-10 NOTE — Progress Notes (Signed)
Subjective:    Patient ID: Hannah Mason, female    DOB: 07-27-1975, 41 y.o.   MRN: [id number]  CC: Hannah Mason is a 41 y.o. female who presents today for follow up.   HPI: CC: right elbow x 2 months. Hit elbow on bed post, improving. Had trouble extending elbow. Exercising 6x per week, doing beach body. Had been using 10-15 pounds, and now down to 5 lbs. No repetitive motions. Pain with bending or press on lateral side. No numbness, tingling, swelling, rash.   Right 1st finger; ho warts. Tried OTC the medications without relief.   CPE with OB GYN.       HISTORY:  Past Medical History:  Diagnosis Date  . ADHD   . Allergy    seasonal  . Fever blister   . Headache(784.0)    frequent  . Scoliosis    Past Surgical History:  Procedure Laterality Date  . CESAREAN SECTION     1  . DILATION AND CURETTAGE OF UTERUS    . [facility]  2011   Miscarrigage in 2011  . TUBAL LIGATION     Family History  Problem Relation Age of Onset  . Diabetes Father   . Hypertension Father   . Breast cancer Neg Hx   . Ovarian cancer Neg Hx   . Colon cancer Neg Hx   . Heart disease Neg Hx     Allergies: Patient has no known allergies. Current Outpatient Prescriptions on File Prior to Visit  Medication Sig Dispense Refill  . levonorgestrel (MIRENA) 20 MCG/24HR IUD 1 each by Intrauterine route once.     No current facility-administered medications on file prior to visit.     Social History  Substance Use Topics  . Smoking status: Former Smoker    Quit date: 12/17/2005  . Smokeless tobacco: Never Used  . Alcohol use No    Review of Systems  Constitutional: Negative for chills and fever.  Respiratory: Negative for cough.   Cardiovascular: Negative for chest pain and palpitations.  Gastrointestinal: Negative for nausea and vomiting.  Musculoskeletal: Negative for back pain and joint swelling.  Skin: Negative for rash.      Objective:    BP 98/68   Pulse 81   Temp 97.7 F (36.5  C) (Oral)   Ht 5\' 7"  (1.702 m)   Wt 145 lb (65.8 kg)   SpO2 97%   BMI 22.71 kg/m  BP Readings from Last 3 Encounters:  03/10/16 98/68  12/05/15 115/75  01/23/11 112/62   Wt Readings from Last 3 Encounters:  03/10/16 145 lb (65.8 kg)  12/05/15 144 lb 4.8 oz (65.5 kg)  01/23/11 152 lb (68.9 kg)    Physical Exam  Constitutional: She appears well-developed and well-nourished.  Eyes: Conjunctivae are normal.  Cardiovascular: Normal rate, regular rhythm, normal heart sounds and normal pulses.   Pulmonary/Chest: Effort normal and breath sounds normal. She has no wheezes. She has no rhonchi. She has no rales.  Musculoskeletal:       Right elbow: She exhibits decreased range of motion. She exhibits no swelling, no effusion and no deformity. Tenderness found. Radial head tenderness noted.  Unable to full extend arm. Tenderness over radial head. More pronounced than left radial head.   Neurological: She is alert.  Skin: Skin is warm and dry.  Psychiatric: She has a normal mood and affect. Her speech is normal and behavior is normal. Thought content normal.  Vitals reviewed.  Assessment & Plan:   Problem List Items Addressed This Visit      Musculoskeletal and Integument   Plantar wart    Failed conservative treatment at home. Referral dermatology for cryotherapy.      Relevant Orders   Ambulatory referral to Dermatology     Other   Right elbow pain - Primary    Pending XR to ensure no fracture, dislocation. Also considering small effusion as unable to fully extend.       Relevant Orders   DG Elbow Complete Right   DG Forearm Right       I have discontinued Ms. Streater's acyclovir. I am also having her maintain her levonorgestrel.   No orders of the defined types were placed in this encounter.   Return precautions given.   Risks, benefits, and alternatives of the medications and treatment plan prescribed today were discussed, and patient expressed  understanding.   Education regarding symptom management and diagnosis given to patient on AVS.  Continue to follow with Rennie PlowmanMargaret Jordon Kristiansen, FNP for routine health maintenance.   Hannah ManoKelly L Heuberger and I agreed with plan.   Rennie PlowmanMargaret Ellene Bloodsaw, FNP

## 2016-05-27 ENCOUNTER — Other Ambulatory Visit: Payer: Self-pay | Admitting: Obstetrics and Gynecology

## 2016-06-18 ENCOUNTER — Other Ambulatory Visit: Payer: Managed Care, Other (non HMO)

## 2016-06-19 LAB — LIPID PANEL
Chol/HDL Ratio: 2.2 ratio (ref 0.0–4.4)
Cholesterol, Total: 111 mg/dL (ref 100–199)
HDL: 51 mg/dL (ref >39)
LDL CALC: 51 mg/dL (ref 0–99)
TRIGLYCERIDES: 43 mg/dL (ref 0–149)
VLDL CHOLESTEROL CAL: 9 mg/dL (ref 5–40)

## 2016-06-19 LAB — GLUCOSE, RANDOM: Glucose: 84 mg/dL (ref 65–99)

## 2016-06-19 LAB — TSH: TSH: 2.02 u[IU]/mL (ref 0.450–4.500)

## 2016-06-19 LAB — VITAMIN D 25 HYDROXY (VIT D DEFICIENCY, FRACTURES): Vit D, 25-Hydroxy: 30.3 ng/mL (ref 30.0–100.0)

## 2016-06-19 LAB — HEMOGLOBIN A1C
Est. average glucose Bld gHb Est-mCnc: 103 mg/dL
Hgb A1c MFr Bld: 5.2 % (ref 4.8–5.6)

## 2016-06-23 ENCOUNTER — Other Ambulatory Visit: Payer: Self-pay | Admitting: Obstetrics and Gynecology

## 2016-06-23 MED ORDER — ACYCLOVIR 400 MG PO TABS: 400.0000 mg | ORAL_TABLET | Freq: Three times a day (TID) | ORAL | 0 refills | Status: DC

## 2016-08-26 ENCOUNTER — Other Ambulatory Visit: Payer: Self-pay | Admitting: Obstetrics and Gynecology

## 2016-10-01 ENCOUNTER — Telehealth: Payer: Self-pay | Admitting: Obstetrics and Gynecology

## 2016-10-01 NOTE — Telephone Encounter (Signed)
Patient has had IUD since Oct of 2016 - she is now having flu like symptoms every month for the past 5 months the week prior to starting her period   Please call

## 2016-10-02 NOTE — Telephone Encounter (Signed)
Pt states for the last five months she has flu like sx 4-5 days before cycle. Chills,possible fever, nausea,h/a, diarrhea, and low energy. She has to lay in bed. Once cycle is on she is fine. S/p iud insert 2016. Appt made for 9/5 at 3:00.

## 2016-10-22 ENCOUNTER — Ambulatory Visit (INDEPENDENT_AMBULATORY_CARE_PROVIDER_SITE_OTHER): Payer: BLUE CROSS/BLUE SHIELD | Admitting: Obstetrics and Gynecology

## 2016-10-22 ENCOUNTER — Encounter: Payer: Self-pay | Admitting: Obstetrics and Gynecology

## 2016-10-22 VITALS — BP 97/60 | HR 67 | Ht 67.0 in | Wt 138.7 lb

## 2016-10-22 DIAGNOSIS — N943 Premenstrual tension syndrome: Secondary | ICD-10-CM | POA: Diagnosis not present

## 2016-10-22 MED ORDER — VALACYCLOVIR HCL 1 G PO TABS: 1000.0000 mg | ORAL_TABLET | Freq: Every day | ORAL | 3 refills | Status: DC

## 2016-10-22 NOTE — Progress Notes (Signed)
Chief complaint: 1. Premenstrual flulike symptoms  Patient presents today for discussion regarding flulike symptoms she has been experiencing over the past 5 months 3-4 days prior to her menses. Once cycle begins, symptoms resolved.  Symptoms include muscle aches, heat intolerance/questionable fever, headaches. She has been taking for ibuprofen twice a day for 3 days during these symptoms episodes. Once her menses begins, all symptoms resolve. Patient reports that her sister has similar type symptomatology. Prior to age 41 patient did not have these atypical symptoms.  Patient has just completed extensive blood work and physical for life insurance. No abnormalities were identified. Patient is training for a half marathons at this time and is running 6-8 miles per day.  Past history, past surgical history, problem list, medications, and allergies are reviewed  OBJECTIVE: BP 97/60   Pulse 67   Ht 5\' 7"  (1.702 m)   Wt 138 lb 11.2 oz (62.9 kg)   LMP 10/02/2016 (Approximate)   BMI 21.72 kg/m  Physical exam-deferred  ASSESSMENT: 1. PMS  PLAN: 1. Maintain hydration 2. Continue using ibuprofen 600 mg 3 times a day 3. Add Tylenol 2 tablets every 6 hours as needed 4. Maintain symptom diary 5. Return in October for annual exam as scheduled  A total of 15 minutes were spent face-to-face with the patient during this encounter and over half of that time dealt with counseling and coordination of care.  Herold HarmsMartin A Brigitta Pricer, MD  Note: This dictation was prepared with Dragon dictation along with smaller phrase technology. Any transcriptional errors that result from this process are unintentional.

## 2016-10-22 NOTE — Patient Instructions (Addendum)
1. Maintain hydration 2. Continue using ibuprofen 600 mg 3 times a day 3. Add Tylenol 2 tablets every 6 hours as needed 4. Maintain symptom diary

## 2016-10-22 NOTE — Addendum Note (Signed)
Addended by: Marchelle FolksMILLER, Bandy Honaker G on: 10/22/2016 03:53 PM   Modules accepted: Orders

## 2016-12-02 ENCOUNTER — Encounter: Payer: Self-pay | Admitting: Internal Medicine

## 2016-12-02 ENCOUNTER — Ambulatory Visit (INDEPENDENT_AMBULATORY_CARE_PROVIDER_SITE_OTHER): Payer: BLUE CROSS/BLUE SHIELD | Admitting: Internal Medicine

## 2016-12-02 VITALS — BP 100/70 | HR 63 | Temp 97.9°F | Wt 137.0 lb

## 2016-12-02 DIAGNOSIS — J069 Acute upper respiratory infection, unspecified: Secondary | ICD-10-CM | POA: Diagnosis not present

## 2016-12-02 DIAGNOSIS — N644 Mastodynia: Secondary | ICD-10-CM

## 2016-12-02 MED ORDER — HYDROCODONE-HOMATROPINE 5-1.5 MG/5ML PO SYRP: 5.0000 mL | ORAL_SOLUTION | Freq: Three times a day (TID) | ORAL | 0 refills | Status: DC | PRN

## 2016-12-02 MED ORDER — AZITHROMYCIN 250 MG PO TABS: ORAL_TABLET | ORAL | 0 refills | Status: DC

## 2016-12-02 NOTE — Progress Notes (Signed)
HPI  Pt presents to the clinic today with c/o cough and headache. This started 1 week ago. The cough is productive of yellow mucous. She denies runny nose, nasal congestion, ear pain, or sore throat. She denies fever, chills or body aches. She has tried Mucinex and essential oils with minimal relief. She has a history of allergies. She has had sick contacts.  Additionally, she c/o breast pain. This has been going on for months. She reports her breasts feel like they are pulling. The pain is worse when she runs. She reports she wears a good supporting bra. She denies back pain. She has never had a mammogram but reports she has one schedule for next month.  Review of Systems        Past Medical History:  Diagnosis Date  . ADHD   . Allergy    seasonal  . Fever blister   . Headache(784.0)    frequent  . Scoliosis     Family History  Problem Relation Age of Onset  . Diabetes Father   . Hypertension Father   . Breast cancer Neg Hx   . Ovarian cancer Neg Hx   . Colon cancer Neg Hx   . Heart disease Neg Hx     Social History   Social History  . Marital status: Married    Spouse name: N/A  . Number of children: N/A  . Years of education: N/A   Occupational History  . Not on file.   Social History Main Topics  . Smoking status: Former Smoker    Quit date: 12/17/2005  . Smokeless tobacco: Never Used  . Alcohol use No  . Drug use: No  . Sexual activity: Yes    Birth control/ protection: IUD   Other Topics Concern  . Not on file   Social History Narrative   Lives with husband in [place], 4 children in home (14,12,5,63months). 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    No Known Allergies   Constitutional: Positive headache. Denies fatigue, fever or  abrupt weight changes.  HEENT:  Denies eye redness, eye pain, pressure behind the eyes, facial pain, nasal congestion, ear pain, ringing in the ears, wax buildup, runny nose or sore throat. Respiratory: Positive cough. Denies difficulty  breathing or shortness of breath.  Cardiovascular: Denies chest pain, chest tightness, palpitations or swelling in the hands or feet.  Skin: Pt reports breast pain.   No other specific complaints in a complete review of systems (except as listed in HPI above).  Objective:   BP 100/70   Pulse 63   Temp 97.9 F (36.6 C) (Oral)   Wt 137 lb (62.1 kg)   SpO2 98%   BMI 21.46 kg/m  Wt Readings from Last 3 Encounters:  12/02/16 137 lb (62.1 kg)  10/22/16 138 lb 11.2 oz (62.9 kg)  03/10/16 145 lb (65.8 kg)     General: Appears her stated age, in NAD.  HEENT: Head: normal shape and size, no sinus tenderness noted;  Ears: Tm's gray and intact, normal light reflex; Nose: mucosa pink and moist, septum midline; Throat/Mouth: + PND. Teeth present, mucosa erythematous and moist, no exudate noted, no lesions or ulcerations noted.  Neck: No cervical lymphadenopathy.  Pulmonary/Chest: Normal effort and positive vesicular breath sounds. No respiratory distress. No wheezes, rales or ronchi noted.  Skin: Breast symmetrical. No masses or lumps identified. No discharge expressed from the nipple.     Assessment & Plan:   Upper Respiratory Infection:  Get some rest and  drink plenty of water eRx for Azithromax x 5 days Rx for Hycodan cough syrup  Breast Pain:  Exam benign She will go for her mammogram and will follow up once we get those results Consider referral to gen surgery for possible breast reduction  RTC as needed or if symptoms persist.   Nicki Reaper, NP

## 2016-12-02 NOTE — Patient Instructions (Signed)
Upper Respiratory Infection, Adult Most upper respiratory infections (URIs) are caused by a virus. A URI affects the nose, throat, and upper air passages. The most common type of URI is often called "the common cold." Follow these instructions at home:  Take medicines only as told by your doctor.  Gargle warm saltwater or take cough drops to comfort your throat as told by your doctor.  Use a warm mist humidifier or inhale steam from a shower to increase air moisture. This may make it easier to breathe.  Drink enough fluid to keep your pee (urine) clear or pale yellow.  Eat soups and other clear broths.  Have a healthy diet.  Rest as needed.  Go back to work when your fever is gone or your doctor says it is okay. ? You may need to stay home longer to avoid giving your URI to others. ? You can also wear a face mask and wash your hands often to prevent spread of the virus.  Use your inhaler more if you have asthma.  Do not use any tobacco products, including cigarettes, chewing tobacco, or electronic cigarettes. If you need help quitting, ask your doctor. Contact a doctor if:  You are getting worse, not better.  Your symptoms are not helped by medicine.  You have chills.  You are getting more short of breath.  You have brown or red mucus.  You have yellow or brown discharge from your nose.  You have pain in your face, especially when you bend forward.  You have a fever.  You have puffy (swollen) neck glands.  You have pain while swallowing.  You have white areas in the back of your throat. Get help right away if:  You have very bad or constant: ? Headache. ? Ear pain. ? Pain in your forehead, behind your eyes, and over your cheekbones (sinus pain). ? Chest pain.  You have long-lasting (chronic) lung disease and any of the following: ? Wheezing. ? Long-lasting cough. ? Coughing up blood. ? A change in your usual mucus.  You have a stiff neck.  You have  changes in your: ? Vision. ? Hearing. ? Thinking. ? Mood. This information is not intended to replace advice given to you by your health care provider. Make sure you discuss any questions you have with your health care provider. Document Released: 07/23/2007 Document Revised: 10/07/2015 Document Reviewed: 05/11/2013 Elsevier Interactive Patient Education  2018 [employer]  

## 2016-12-22 NOTE — Progress Notes (Signed)
ANNUAL PREVENTATIVE CARE GYN  ENCOUNTER NOTE  Subjective:       Hannah Mason is a 41 y.o. No obstetric history on file. female here for a routine annual gynecologic exam.  Current complaints: 1.   none  Cycles are light with the IUD in place.  Patient still reports premenstrual flulike symptoms which are slowly resolving.  These have been ongoing for approximately the past year. Bowel and bladder function are normal. Patient has completed her first 10 K in approximately 54 minutes.  She is now looking towards doing a half marathon.  Gynecologic History LMP- 12/12/2016 Contraception: IUD- mirena inserted 11/16/2013 Last Pap: 11/2015 neg/neg. Results were: normal Last mammogram: n/a Results were: normal  Obstetric History OB History  Gravida Para Term Preterm AB Living  3 2 2   1 2   SAB TAB Ectopic Multiple Live Births  1       2    # Outcome Date GA Lbr Len/2nd Weight Sex Delivery Anes PTL Lv  3 Term 2012   6 lb 14.4 oz (3.13 kg) F CS-LTranv   LIV  2 SAB 2010          1 Term 2007   6 lb 2.2 oz (2.785 kg) F CS-LTranv   LIV      Past Medical History:  Diagnosis Date  . ADHD   . Allergy    seasonal  . Fever blister   . Headache(784.0)    frequent  . Scoliosis     Past Surgical History:  Procedure Laterality Date  . CESAREAN SECTION     1  . [facility]   Miscarrigage in 2011  . TUBAL LIGATION      Current Outpatient Medications on File Prior to Visit  Medication Sig Dispense Refill  . azithromycin (ZITHROMAX) 250 MG tablet Take 2 tabs today, then 1 tab daily x 4 days 6 tablet 0  . HYDROcodone-homatropine (HYCODAN) 5-1.5 MG/5ML syrup Take 5 mLs by mouth every 8 (eight) hours as needed. 75 mL 0  . levonorgestrel (MIRENA) 20 MCG/24HR IUD 1 each by Intrauterine route once.    . valACYclovir (VALTREX) 1000 MG tablet Take 1 tablet (1,000 mg total) by mouth daily. 5 tablet 3   No current facility-administered medications on file  prior to visit.     No Known Allergies  Social History   Socioeconomic History  . Marital status: Married    Spouse name: Not on file  . Number of children: Not on file  . Years of education: Not on file  . Highest education level: Not on file  Social Needs  . Financial resource strain: Not on file  . Food insecurity - worry: Not on file  . Food insecurity - inability: Not on file  . Transportation needs - medical: Not on file  . Transportation needs - non-medical: Not on file  Occupational History  . Not on file  Tobacco Use  . Smoking status: Former Smoker    Last attempt to quit: 12/17/2005    Years since quitting: 11.0  . Smokeless tobacco: Never Used  Substance and Sexual Activity  . Alcohol use: No  . Drug use: No  . Sexual activity: Yes    Birth control/protection: IUD  Other Topics Concern  . Not on file  Social History Narrative   Lives with husband in [place], 4 children in home (14,12,5,605months). No pets. Work Engineer, [employer].    Family  History  Problem Relation Age of Onset  . Diabetes Father   . Hypertension Father   . Breast cancer Neg Hx   . Ovarian cancer Neg Hx   . Colon cancer Neg Hx   . Heart disease Neg Hx     The following portions of the patient's history were reviewed and updated as appropriate: allergies, current medications, past family history, past medical history, past social history, past surgical history and problem list.  Review of Systems Review of Systems  Constitutional:       Premenstrual flulike symptoms, mild, resolved once menses starts  Eyes: Negative.   Respiratory: Negative.   Cardiovascular: Negative.   Gastrointestinal: Negative.   Genitourinary: Negative.   Musculoskeletal: Negative.   Skin: Negative.   Neurological: Negative.   Endo/Heme/Allergies: Negative.   Psychiatric/Behavioral: Negative.       Objective:  BP 107/67   Pulse 76   Ht 5\' 7"  (1.702 m)   Wt 136 lb 4.8 oz (61.8 kg)   LMP 12/12/2016 (Exact Date)    BMI 21.35 kg/m  CONSTITUTIONAL: Well-developed, well-nourished female in no acute distress.  PSYCHIATRIC: Normal mood and affect. Normal behavior. Normal judgment and thought content. NEUROLGIC: Alert and oriented to person, place, and time. Normal muscle tone coordination. No cranial nerve deficit noted. HENT:  Normocephalic, atraumatic, External right and left ear normal.  EYES: Conjunctivae and EOM are normal.  No scleral icterus.  NECK: Normal range of motion, supple, no masses.  Normal thyroid.  SKIN: Skin is warm and dry. No rash noted. Not diaphoretic. No erythema. No pallor. CARDIOVASCULAR: Normal heart rate noted, regular rhythm, no murmur. RESPIRATORY: Clear to auscultation bilaterally. Effort and breath sounds normal, no problems with respiration noted. BREASTS: Symmetric in size. No masses, skin changes, nipple drainage, or lymphadenopathy. ABDOMEN: Soft, normal bowel sounds, no distention noted.  No tenderness, rebound or guarding.  BLADDER: Normal PELVIC:  External Genitalia: Normal  BUS: Normal  Vagina: Normal; minimal secretions present.  Cervix: Normal; IUD string not visualized; no cervical motion tenderness  Uterus: Normal; midplane, normal size and shape, mobile, nontender  Adnexa: Normal; nonpalpable and nontender  RV: External Exam NormaI; normal sphincter tone; no rectal masses MUSCULOSKELETAL: Normal range of motion. No tenderness.  No cyanosis, clubbing, or edema.  2+ distal pulses. LYMPHATIC: No Axillary, Supraclavicular, or Inguinal Adenopathy.    Assessment:   Annual gynecologic examination 41 y.o. Contraception: IUD; BTL Normal BMI Problem List Items Addressed This Visit    Encounter for routine checking of intrauterine contraceptive device (IUD)   HSV-1 infection   PMS (premenstrual syndrome)    Other Visit Diagnoses    Well woman exam with routine gynecological exam    -  Primary   Screening for breast cancer        Nonvisualization of IUD  string  Plan:  Pap: Due 2020 Mammogram: ordered Stool Guaiac Testing:  Not Indicated Labs:utd 06/2016 Routine preventative health maintenance measures emphasized: Exercise/Diet/Weight control, Tobacco Warnings and Alcohol/Substance use risks Return to Clinic - 1 Year   Crystal Milo, CMA  [employer]   Note: This dictation was prepared with [employer] dictation along with smaller L[employer]. Any transcriptional errors that result from this process are unintentional.   Note: This dictation was prepared with Dragon dictation along with smaller phrase technology. Any transcriptional errors that result from this process are unintentional.

## 2016-12-24 ENCOUNTER — Ambulatory Visit (INDEPENDENT_AMBULATORY_CARE_PROVIDER_SITE_OTHER): Payer: BLUE CROSS/BLUE SHIELD | Admitting: Obstetrics and Gynecology

## 2016-12-24 ENCOUNTER — Encounter: Payer: Self-pay | Admitting: Obstetrics and Gynecology

## 2016-12-24 VITALS — BP 107/67 | HR 76 | Ht 67.0 in | Wt 136.3 lb

## 2016-12-24 DIAGNOSIS — B009 Herpesviral infection, unspecified: Secondary | ICD-10-CM | POA: Diagnosis not present

## 2016-12-24 DIAGNOSIS — N943 Premenstrual tension syndrome: Secondary | ICD-10-CM

## 2016-12-24 DIAGNOSIS — Z1231 Encounter for screening mammogram for malignant neoplasm of breast: Secondary | ICD-10-CM

## 2016-12-24 DIAGNOSIS — Z01419 Encounter for gynecological examination (general) (routine) without abnormal findings: Secondary | ICD-10-CM | POA: Diagnosis not present

## 2016-12-24 DIAGNOSIS — Z30431 Encounter for routine checking of intrauterine contraceptive device: Secondary | ICD-10-CM

## 2016-12-24 DIAGNOSIS — Z1239 Encounter for other screening for malignant neoplasm of breast: Secondary | ICD-10-CM

## 2016-12-24 NOTE — Patient Instructions (Signed)
1.  No Pap smear needed.  Next Pap is due in 2020 2.  Mammogram is ordered 3.  Screening labs are ordered 4.  Continue with healthy eating and exercise. 5.  Return in 1 year for annual exam.  Health Maintenance, Female Adopting a healthy lifestyle and getting preventive care can go a long way to promote health and wellness. Talk with your health care provider about what schedule of regular examinations is right for you. This is a good chance for you to check in with your provider about disease prevention and staying healthy. In between checkups, there are plenty of things you can do on your own. Experts have done a lot of research about which lifestyle changes and preventive measures are most likely to keep you healthy. Ask your health care provider for more information. Weight and diet Eat a healthy diet  Be sure to include plenty of vegetables, fruits, low-fat dairy products, and lean protein.  Do not eat a lot of foods high in solid fats, added sugars, or salt.  Get regular exercise. This is one of the most important things you can do for your health. ? Most adults should exercise for at least 150 minutes each week. The exercise should increase your heart rate and make you sweat (moderate-intensity exercise). ? Most adults should also do strengthening exercises at least twice a week. This is in addition to the moderate-intensity exercise.  Maintain a healthy weight  Body mass index (BMI) is a measurement that can be used to identify possible weight problems. It estimates body fat based on height and weight. Your health care provider can help determine your BMI and help you achieve or maintain a healthy weight.  For females 64 years of age and older: ? A BMI below 18.5 is considered underweight. ? A BMI of 18.5 to 24.9 is normal. ? A BMI of 25 to 29.9 is considered overweight. ? A BMI of 30 and above is considered obese.  Watch levels of cholesterol and blood lipids  You should  start having your blood tested for lipids and cholesterol at 40 years of age, then have this test every 5 years.  You may need to have your cholesterol levels checked more often if: ? Your lipid or cholesterol levels are high. ? You are older than 40 years of age. ? You are at high risk for heart disease.  Cancer screening Lung Cancer  Lung cancer screening is recommended for adults 59-42 years old who are at high risk for lung cancer because of a history of smoking.  A yearly low-dose CT scan of the lungs is recommended for people who: ? Currently smoke. ? Have quit within the past 15 years. ? Have at least a 30-pack-year history of smoking. A pack year is smoking an average of one pack of cigarettes a day for 1 year.  Yearly screening should continue until it has been 15 years since you quit.  Yearly screening should stop if you develop a health problem that would prevent you from having lung cancer treatment.  Breast Cancer  Practice breast self-awareness. This means understanding how your breasts normally appear and feel.  It also means doing regular breast self-exams. Let your health care provider know about any changes, no matter how small.  If you are in your 20s or 30s, you should have a clinical breast exam (CBE) by a health care provider every 1-3 years as part of a regular health exam.  If you are 40  or older, have a CBE every year. Also consider having a breast X-ray (mammogram) every year.  If you have a family history of breast cancer, talk to your health care provider about genetic screening.  If you are at high risk for breast cancer, talk to your health care provider about having an MRI and a mammogram every year.  Breast cancer gene (BRCA) assessment is recommended for women who have family members with BRCA-related cancers. BRCA-related cancers include: ? Breast. ? Ovarian. ? Tubal. ? Peritoneal cancers.  Results of the assessment will determine the need  for genetic counseling and BRCA1 and BRCA2 testing.  Cervical Cancer Your health care provider may recommend that you be screened regularly for cancer of the pelvic organs (ovaries, uterus, and vagina). This screening involves a pelvic examination, including checking for microscopic changes to the surface of your cervix (Pap test). You may be encouraged to have this screening done every 3 years, beginning at age 31.  For women ages 64-65, health care providers may recommend pelvic exams and Pap testing every 3 years, or they may recommend the Pap and pelvic exam, combined with testing for human papilloma virus (HPV), every 5 years. Some types of HPV increase your risk of cervical cancer. Testing for HPV may also be done on women of any age with unclear Pap test results.  Other health care providers may not recommend any screening for nonpregnant women who are considered low risk for pelvic cancer and who do not have symptoms. Ask your health care provider if a screening pelvic exam is right for you.  If you have had past treatment for cervical cancer or a condition that could lead to cancer, you need Pap tests and screening for cancer for at least 20 years after your treatment. If Pap tests have been discontinued, your risk factors (such as having a new sexual partner) need to be reassessed to determine if screening should resume. Some women have medical problems that increase the chance of getting cervical cancer. In these cases, your health care provider may recommend more frequent screening and Pap tests.  Colorectal Cancer  This type of cancer can be detected and often prevented.  Routine colorectal cancer screening usually begins at 40 years of age and continues through 40 years of age.  Your health care provider may recommend screening at an earlier age if you have risk factors for colon cancer.  Your health care provider may also recommend using home test kits to check for hidden blood in  the stool.  A small camera at the end of a tube can be used to examine your colon directly (sigmoidoscopy or colonoscopy). This is done to check for the earliest forms of colorectal cancer.  Routine screening usually begins at age 30.  Direct examination of the colon should be repeated every 5-10 years through 40 years of age. However, you may need to be screened more often if early forms of precancerous polyps or small growths are found.  Skin Cancer  Check your skin from head to toe regularly.  Tell your health care provider about any new moles or changes in moles, especially if there is a change in a mole's shape or color.  Also tell your health care provider if you have a mole that is larger than the size of a pencil eraser.  Always use sunscreen. Apply sunscreen liberally and repeatedly throughout the day.  Protect yourself by wearing long sleeves, pants, a wide-brimmed hat, and sunglasses whenever you are  outside.  Heart disease, diabetes, and high blood pressure  High blood pressure causes heart disease and increases the risk of stroke. High blood pressure is more likely to develop in: ? People who have blood pressure in the high end of the normal range (130-139/85-89 mm Hg). ? People who are overweight or obese. ? People who are African American.  If you are 18-39 years of age, have your blood pressure checked every 3-5 years. If you are 40 years of age or older, have your blood pressure checked every year. You should have your blood pressure measured twice-once when you are at a hospital or clinic, and once when you are not at a hospital or clinic. Record the average of the two measurements. To check your blood pressure when you are not at a hospital or clinic, you can use: ? An automated blood pressure machine at a pharmacy. ? A home blood pressure monitor.  If you are between 55 years and 79 years old, ask your health care provider if you should take aspirin to prevent  strokes.  Have regular diabetes screenings. This involves taking a blood sample to check your fasting blood sugar level. ? If you are at a normal weight and have a low risk for diabetes, have this test once every three years after 40 years of age. ? If you are overweight and have a high risk for diabetes, consider being tested at a younger age or more often. Preventing infection Hepatitis B  If you have a higher risk for hepatitis B, you should be screened for this virus. You are considered at high risk for hepatitis B if: ? You were born in a country where hepatitis B is common. Ask your health care provider which countries are considered high risk. ? Your parents were born in a high-risk country, and you have not been immunized against hepatitis B (hepatitis B vaccine). ? You have HIV or AIDS. ? You use needles to inject street drugs. ? You live with someone who has hepatitis B. ? You have had sex with someone who has hepatitis B. ? You get hemodialysis treatment. ? You take certain medicines for conditions, including cancer, organ transplantation, and autoimmune conditions.  Hepatitis C  Blood testing is recommended for: ? Everyone born from 1945 through 1965. ? Anyone with known risk factors for hepatitis C.  Sexually transmitted infections (STIs)  You should be screened for sexually transmitted infections (STIs) including gonorrhea and chlamydia if: ? You are sexually active and are younger than 40 years of age. ? You are older than 40 years of age and your health care provider tells you that you are at risk for this type of infection. ? Your sexual activity has changed since you were last screened and you are at an increased risk for chlamydia or gonorrhea. Ask your health care provider if you are at risk.  If you do not have HIV, but are at risk, it may be recommended that you take a prescription medicine daily to prevent HIV infection. This is called pre-exposure prophylaxis  (PrEP). You are considered at risk if: ? You are sexually active and do not regularly use condoms or know the HIV status of your partner(s). ? You take drugs by injection. ? You are sexually active with a partner who has HIV.  Talk with your health care provider about whether you are at high risk of being infected with HIV. If you choose to begin PrEP, you should first be tested   for HIV. You should then be tested every 3 months for as long as you are taking PrEP. Pregnancy  If you are premenopausal and you may become pregnant, ask your health care provider about preconception counseling.  If you may become pregnant, take 400 to 800 micrograms (mcg) of folic acid every day.  If you want to prevent pregnancy, talk to your health care provider about birth control (contraception). Osteoporosis and menopause  Osteoporosis is a disease in which the bones lose minerals and strength with aging. This can result in serious bone fractures. Your risk for osteoporosis can be identified using a bone density scan.  If you are 7 years of age or older, or if you are at risk for osteoporosis and fractures, ask your health care provider if you should be screened.  Ask your health care provider whether you should take a calcium or vitamin D supplement to lower your risk for osteoporosis.  Menopause may have certain physical symptoms and risks.  Hormone replacement therapy may reduce some of these symptoms and risks. Talk to your health care provider about whether hormone replacement therapy is right for you. Follow these instructions at home:  Schedule regular health, dental, and eye exams.  Stay current with your immunizations.  Do not use any tobacco products including cigarettes, chewing tobacco, or electronic cigarettes.  If you are pregnant, do not drink alcohol.  If you are breastfeeding, limit how much and how often you drink alcohol.  Limit alcohol intake to no more than 1 drink per day for  nonpregnant women. One drink equals 12 ounces of beer, 5 ounces of wine, or 1 ounces of hard liquor.  Do not use street drugs.  Do not share needles.  Ask your health care provider for help if you need support or information about quitting drugs.  Tell your health care provider if you often feel depressed.  Tell your health care provider if you have ever been abused or do not feel safe at home. This information is not intended to replace advice given to you by your health care provider. Make sure you discuss any questions you have with your health care provider. Document Released: 08/19/2010 Document Revised: 07/12/2015 Document Reviewed: 11/07/2014 Elsevier Interactive Patient Education  [employer].

## 2017-01-22 ENCOUNTER — Ambulatory Visit
Admission: RE | Admit: 2017-01-22 | Discharge: 2017-01-22 | Disposition: A | Discharge status: Home / Self Care | Payer: BLUE CROSS/BLUE SHIELD | Source: Ambulatory Visit | Attending: Obstetrics and Gynecology | Admitting: Obstetrics and Gynecology

## 2017-01-22 DIAGNOSIS — Z1239 Encounter for other screening for malignant neoplasm of breast: Secondary | ICD-10-CM

## 2017-01-22 DIAGNOSIS — Z1231 Encounter for screening mammogram for malignant neoplasm of breast: Secondary | ICD-10-CM | POA: Diagnosis not present

## 2017-03-08 ENCOUNTER — Other Ambulatory Visit: Payer: Self-pay | Admitting: Obstetrics and Gynecology

## 2017-04-30 ENCOUNTER — Ambulatory Visit: Payer: BLUE CROSS/BLUE SHIELD | Admitting: Family Medicine

## 2017-04-30 ENCOUNTER — Encounter: Payer: Self-pay | Admitting: Family Medicine

## 2017-04-30 ENCOUNTER — Other Ambulatory Visit: Payer: Self-pay

## 2017-04-30 VITALS — BP 106/70 | HR 102 | Temp 98.0°F | Ht 67.0 in | Wt 137.5 lb

## 2017-04-30 DIAGNOSIS — J0100 Acute maxillary sinusitis, unspecified: Secondary | ICD-10-CM | POA: Diagnosis not present

## 2017-04-30 MED ORDER — AMOXICILLIN-POT CLAVULANATE 875-125 MG PO TABS: 1.0000 | ORAL_TABLET | Freq: Two times a day (BID) | ORAL | 0 refills | Status: AC

## 2017-04-30 NOTE — Progress Notes (Signed)
Dr. Karleen Hampshire T. Joziah Dollins, MD, CAQ Sports Medicine [facility] [address] [place], [address] Phone: [phone] Fax: [phone]  04/30/2017  Patient: Hannah Mason, MRN: [id number], DOB: 01-07-1976, 42 y.o.  Primary Physician:  Lorre Munroe, NP   Chief Complaint  Patient presents with  . Cough  . Hoarse  . Nasal Congestion  . Ear Pain   Subjective:   This 42 y.o. female patient presents with runny nose, sneezing, cough, sore throat, malaise and minimal / low-grade fever for > 1 week. Now the primary complaint has become sinus pressure and pain behind the eyes and in the upper, anterior face.   Husband has been sick, 3 weeks. Started off with 3 weeks, then ears hurting L >R.  About 10 days.   The patent denies sore throat as the primary complaint. Denies sthortness of breath/wheezing, high fever, chest pain, significant myalgia, otalgia, abdominal pain, changes in bowel or bladder.  PMH, PHS, Allergies, Problem List, Medications, Family History, and Social History have all been reviewed.  Patient Active Problem List   Diagnosis Date Noted  . PMS (premenstrual syndrome) 10/22/2016  . Right elbow pain 03/10/2016  . Plantar wart 03/10/2016  . Mastodynia 12/05/2015  . Encounter for routine checking of intrauterine contraceptive device (IUD) 12/05/2015  . HSV-1 infection 12/05/2015  . Post partum depression 12/19/2010    Past Medical History:  Diagnosis Date  . ADHD   . Allergy    seasonal  . Fever blister   . Headache(784.0)    frequent  . Scoliosis     Past Surgical History:  Procedure Laterality Date  . CESAREAN SECTION     1  . DILATION AND CURETTAGE OF UTERUS    . [facility]  2011   Miscarrigage in 2011  . TUBAL LIGATION      Social History   Socioeconomic History  . Marital status: Married    Spouse name: Not on file  . Number of children: Not on file  . Years of education: Not on file  . Highest education level: Not on  file  Social Needs  . Financial resource strain: Not on file  . Food insecurity - worry: Not on file  . Food insecurity - inability: Not on file  . Transportation needs - medical: Not on file  . Transportation needs - non-medical: Not on file  Occupational History  . Not on file  Tobacco Use  . Smoking status: Former Smoker    Last attempt to quit: 12/17/2005    Years since quitting: 11.3  . Smokeless tobacco: Never Used  Substance and Sexual Activity  . Alcohol use: No  . Drug use: No  . Sexual activity: Yes    Birth control/protection: IUD  Other Topics Concern  . Not on file  Social History Narrative   Lives with husband in [place], 4 children in home (14,12,5,2months). 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    Family History  Problem Relation Age of Onset  . Diabetes Father   . Hypertension Father   . Breast cancer Neg Hx   . Ovarian cancer Neg Hx   . Colon cancer Neg Hx   . Heart disease Neg Hx     No Known Allergies  Medication list reviewed and updated in full in [facility].  ROS as above, eating and drinking - tolerating PO. Urinating normally. No excessive vomitting or diarrhea. O/w as above.  Objective:   Blood pressure 106/70, pulse Marland Kitchen)  102, temperature 98 F (36.7 C), temperature source Oral, height 5\' 7"  (1.702 m), weight 137 lb 8 oz (62.4 kg), last menstrual period 04/15/2017, SpO2 97 %.  GEN: WDWN, Non-toxic, Atraumatic, normocephalic. A and O x 3. HEENT: Oropharynx clear without exudate, MMM, no significant LAD, mild rhinnorhea Sinuses: Right Frontal, ethmoid, and maxillary: Tender Left Frontal, Ethmoid, and maxillary: Tender Ears: TM clear, COL visualized with good landmarks CV: RRR, no m/g/r. Pulm: CTA B, no wheezes, rhonchi, or crackles, normal respiratory effort. EXT: no c/c/e Psych: well oriented, neither depressed nor anxious in appearance  Assessment and Plan:   Acute non-recurrent maxillary sinusitis  Acute sinusitis: ABX as below.   Reviewed  symptomatic care as well as ABX in this case.    Follow-up: No Follow-up on file.  Meds ordered this encounter  Medications  . amoxicillin-clavulanate (AUGMENTIN) 875-125 MG tablet    Sig: Take 1 tablet by mouth 2 (two) times daily for 10 days.    Dispense:  20 tablet    Refill:  0   Signed,  Karlin Heilman T. Hadessah Grennan, MD  Patient's Medications  New Prescriptions   AMOXICILLIN-CLAVULANATE (AUGMENTIN) 875-125 MG TABLET    Take 1 tablet by mouth 2 (two) times daily for 10 days.  Previous Medications   LEVONORGESTREL (MIRENA) 20 MCG/24HR IUD    1 each by Intrauterine route once.   VALACYCLOVIR (VALTREX) 1000 MG TABLET    TAKE 1 TABLET(1000 MG) BY MOUTH DAILY  Modified Medications   No medications on file  Discontinued Medications   No medications on file

## 2017-05-01 ENCOUNTER — Ambulatory Visit: Payer: BLUE CROSS/BLUE SHIELD | Admitting: Family Medicine

## 2017-09-23 ENCOUNTER — Telehealth: Payer: Self-pay | Admitting: Obstetrics and Gynecology

## 2017-09-23 MED ORDER — VALACYCLOVIR HCL 1 G PO TABS: 1000.0000 mg | ORAL_TABLET | Freq: Every day | ORAL | 5 refills | Status: DC

## 2017-09-23 NOTE — Telephone Encounter (Signed)
Pt states she is under a lot of stress. She is in the process of building a house. She is keeping fever blisters. Advised pt to take 1000 valtrex qd. Med rex.

## 2017-09-23 NOTE — Telephone Encounter (Signed)
The patient called and stated that she has a prescription for a fever blister and she stated that they are getting worse the patient would lie to know if she is able to take two pills instead of one. Please advise.

## 2017-11-17 DIAGNOSIS — F411 Generalized anxiety disorder: Secondary | ICD-10-CM | POA: Diagnosis not present

## 2017-11-17 DIAGNOSIS — F419 Anxiety disorder, unspecified: Secondary | ICD-10-CM | POA: Diagnosis not present

## 2017-12-28 ENCOUNTER — Ambulatory Visit
Admission: EM | Admit: 2017-12-28 | Discharge: 2017-12-28 | Disposition: A | Discharge status: Home / Self Care | Payer: BLUE CROSS/BLUE SHIELD | Attending: Emergency Medicine | Admitting: Emergency Medicine

## 2017-12-28 ENCOUNTER — Other Ambulatory Visit: Payer: Self-pay

## 2017-12-28 ENCOUNTER — Encounter: Payer: Self-pay | Admitting: Emergency Medicine

## 2017-12-28 DIAGNOSIS — J069 Acute upper respiratory infection, unspecified: Secondary | ICD-10-CM

## 2017-12-28 DIAGNOSIS — J0140 Acute pansinusitis, unspecified: Secondary | ICD-10-CM

## 2017-12-28 LAB — RAPID STREP SCREEN (MED CTR MEBANE ONLY): STREPTOCOCCUS, GROUP A SCREEN (DIRECT): NEGATIVE

## 2017-12-28 MED ORDER — FLUTICASONE PROPIONATE 50 MCG/ACT NA SUSP: 2.0000 | Freq: Every day | NASAL | 0 refills | Status: DC

## 2017-12-28 MED ORDER — IBUPROFEN 600 MG PO TABS: 600.0000 mg | ORAL_TABLET | Freq: Four times a day (QID) | ORAL | 0 refills | Status: DC | PRN

## 2017-12-28 MED ORDER — DOXYCYCLINE HYCLATE 100 MG PO CAPS: 100.0000 mg | ORAL_CAPSULE | Freq: Two times a day (BID) | ORAL | 0 refills | Status: AC

## 2017-12-28 MED ORDER — BENZONATATE 200 MG PO CAPS: 200.0000 mg | ORAL_CAPSULE | Freq: Three times a day (TID) | ORAL | 0 refills | Status: DC | PRN

## 2017-12-28 MED ORDER — HYDROCOD POLST-CPM POLST ER 10-8 MG/5ML PO SUER: 5.0000 mL | Freq: Two times a day (BID) | ORAL | 0 refills | Status: DC | PRN

## 2017-12-28 NOTE — ED Provider Notes (Signed)
HPI  SUBJECTIVE:  Hannah Mason is a 42 y.o. female who presents with 5 days of right ear pain, bilateral sore throat in the right more so than left, nasal congestion, rhinorrhea, postnasal drip.  She reports sinus pain and pressure behind her eyes and along the frontal sinuses.  She reports upper body aches.  She reports a cough that kept her up all of last night.  No fevers above 100.4.  No Upper dental pain.  No sensation of throat swelling shut, drooling, trismus, voice changes, difficulty breathing, neck stiffness.  No wheezing, chest pain, shortness of breath, dyspnea on exertion.  No GERD or allergy symptoms.  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  No antibiotics in the past month.  No antipyretic for the past 4 to 6 hours.  She tried essential oils and Robitussin sinus without improvement in her symptoms.  No aggravating factors.  She has a past medical history of seasonal allergies and states that this does not feel like those.  No history of asthma, smoking, vaping, diabetes, hypertension, recurrent strep, mono.  LMP: Yesterday.  ZOX:WRUEA, Salvadore Oxford, NP   Past Medical History:  Diagnosis Date  . ADHD   . Allergy    seasonal  . Fever blister   . Headache(784.0)    frequent  . Scoliosis     Past Surgical History:  Procedure Laterality Date  . CESAREAN SECTION     1  . DILATION AND CURETTAGE OF UTERUS    . [facility]  2011   Miscarrigage in 2011  . TUBAL LIGATION      Family History  Problem Relation Age of Onset  . Diabetes Father   . Hypertension Father   . Breast cancer Neg Hx   . Ovarian cancer Neg Hx   . Colon cancer Neg Hx   . Heart disease Neg Hx     Social History   Tobacco Use  . Smoking status: Former Smoker    Last attempt to quit: 12/17/2005    Years since quitting: 12.0  . Smokeless tobacco: Never Used  Substance Use Topics  . Alcohol use: No  . Drug use: No    No current facility-administered medications for this encounter.    Current Outpatient Medications:  .  levonorgestrel (MIRENA) 20 MCG/24HR IUD, 1 each by Intrauterine route once., Disp: , Rfl:  .  valACYclovir (VALTREX) 1000 MG tablet, Take 1 tablet (1,000 mg total) by mouth daily., Disp: 30 tablet, Rfl: 5 .  benzonatate (TESSALON) 200 MG capsule, Take 1 capsule (200 mg total) by mouth 3 (three) times daily as needed for cough., Disp: 30 capsule, Rfl: 0 .  chlorpheniramine-HYDROcodone (TUSSIONEX PENNKINETIC ER) 10-8 MG/5ML SUER, Take 5 mLs by mouth every 12 (twelve) hours as needed for cough., Disp: 60 mL, Rfl: 0 .  doxycycline (VIBRAMYCIN) 100 MG capsule, Take 1 capsule (100 mg total) by mouth 2 (two) times daily for 7 days., Disp: 14 capsule, Rfl: 0 .  fluticasone (FLONASE) 50 MCG/ACT nasal spray, Place 2 sprays into both nostrils daily., Disp: 16 g, Rfl: 0 .  ibuprofen (ADVIL,MOTRIN) 600 MG tablet, Take 1 tablet (600 mg total) by mouth every 6 (six) hours as needed., Disp: 30 tablet, Rfl: 0  No Known Allergies   ROS  As noted in HPI.   Physical Exam  BP 111/84 (BP Location: Right Arm)   Pulse 83   Temp 98.5 F (36.9 C) (Oral)   Resp 18  Ht 5\' 7"  (1.702 m)   Wt 60.3 kg   SpO2 100%   BMI 20.83 kg/m   Constitutional: Well developed, well nourished, no acute distress Eyes:  EOMI, conjunctiva normal bilaterally HENT: Normocephalic, atraumatic,mucus membranes moist.  Right external ear normal.  Mild pain with traction on pinna.  No pain with palpation of tragus or mastoid.  Right external ear canal slightly erythematous, TM normal.  No tenderness over the TMJ bilaterally.  Left TM normal.  Positive nasal congestion, erythematous, swollen turbinates on the right more so than left.  Positive maxillary and frontal sinus tenderness.  Normal oropharynx, positive postnasal drip. Neck: Positive tender anterior right-sided cervical lymphadenopathy.  No posterior cervical lymphadenopathy Respiratory: Normal inspiratory effort lungs clear bilaterally, good  air movement cardiovascular: Normal rate, regular rhythm, no murmurs GI: nondistended soft, no splenomegaly skin: No rash, skin intact Musculoskeletal: no deformities Neurologic: Alert & oriented x 3, no focal neuro deficits Psychiatric: Speech and behavior appropriate   ED Course   Medications - No data to display  Orders Placed This Encounter  Procedures  . Rapid Strep Screen ([facility] ONLY)    Standing Status:   Standing    Number of Occurrences:   1    Order Specific Question:   Patient immune status    Answer:   Normal  . Culture, group A strep    Standing Status:   Standing    Number of Occurrences:   1    Results for orders placed or performed during the hospital encounter of 12/28/17 (from the past 24 hour(s))  Rapid Strep Screen ([facility] ONLY)     Status: None   Collection Time: 12/28/17  7:26 PM  Result Value Ref Range   Streptococcus, Group A Screen (Direct) NEGATIVE NEGATIVE   No results found.  ED Clinical Impression  Upper respiratory tract infection, unspecified type  Acute non-recurrent pansinusitis   ED Assessment/Plan  [place] Narcotic database reviewed for this patient, and feel that the risk/benefit ratio today is favorable for proceeding with a prescription for controlled substance.  Last opiate prescription was in October 2018 with some cough syrup.  Rapid strep negative.  Patient with a URI, sinusitis, most likely viral at this point in time.  Home with saline nasal irrigation, Flonase, ibuprofen 600 mg take with 1 g of Tylenol together 3 or 4 times a day as needed for pain, headache, Mucinex D maximum strength twice a day.  Tussionex for the cough at night, Tessalon for the cough during the day.  Advised patient to be careful with the Tussionex.  Wait-and-see prescription of doxycycline.  Advised her to not fill this for another 5 days.  She has no clear indications for antibiotics at this point in time.  Discussed labs, medical decision  making, treatment plan and plan for follow-up with patient.  She agrees with plan.  Meds ordered this encounter  Medications  . fluticasone (FLONASE) 50 MCG/ACT nasal spray    Sig: Place 2 sprays into both nostrils daily.    Dispense:  16 g    Refill:  0  . chlorpheniramine-HYDROcodone (TUSSIONEX PENNKINETIC ER) 10-8 MG/5ML SUER    Sig: Take 5 mLs by mouth every 12 (twelve) hours as needed for cough.    Dispense:  60 mL    Refill:  0  . benzonatate (TESSALON) 200 MG capsule    Sig: Take 1 capsule (200 mg total) by mouth 3 (three) times daily as needed for cough.  Dispense:  30 capsule    Refill:  0  . ibuprofen (ADVIL,MOTRIN) 600 MG tablet    Sig: Take 1 tablet (600 mg total) by mouth every 6 (six) hours as needed.    Dispense:  30 tablet    Refill:  0  . doxycycline (VIBRAMYCIN) 100 MG capsule    Sig: Take 1 capsule (100 mg total) by mouth 2 (two) times daily for 7 days.    Dispense:  14 capsule    Refill:  0    *This clinic note was created using Scientist, clinical (histocompatibility and immunogenetics). Therefore, there may be occasional mistakes despite careful proofreading.   ?   Domenick Gong, MD 12/28/17 2040

## 2017-12-28 NOTE — ED Triage Notes (Signed)
Patient c/o cough, sore throat, ear pain and headache that started 1 week ago. Patient has taking Robitussin OTC with no relief.

## 2017-12-28 NOTE — Discharge Instructions (Addendum)
Take the medication as written. Start Mucinex-D to keep the mucous thin and to decongest you. You may take 600 mg of motrin with 1 gram of tylenol up to 3-4 times a day as needed for pain. This is an effective combination for pain.  Most sinus infections are viral and do not need antibiotics unless you have a high fever, have had this for 10 days, or you get better and then get sick again. Use a NeilMed sinus rinse with distilled water as often as you want to to reduce nasal congestion. Follow the directions on the box.   Go to www.goodrx.com to look up your medications. This will give you a list of where you can find your prescriptions at the most affordable prices. Or you can ask the pharmacist what the cash price is. This is frequently cheaper than going through insurance.

## 2017-12-30 ENCOUNTER — Encounter: Payer: BLUE CROSS/BLUE SHIELD | Admitting: Obstetrics and Gynecology

## 2017-12-31 ENCOUNTER — Encounter: Payer: BLUE CROSS/BLUE SHIELD | Admitting: Obstetrics and Gynecology

## 2017-12-31 LAB — CULTURE, GROUP A STREP (THRC)

## 2018-01-07 ENCOUNTER — Encounter: Payer: BLUE CROSS/BLUE SHIELD | Admitting: Obstetrics and Gynecology

## 2018-02-02 ENCOUNTER — Encounter: Payer: BLUE CROSS/BLUE SHIELD | Admitting: Obstetrics and Gynecology

## 2018-03-02 ENCOUNTER — Telehealth: Payer: Self-pay

## 2018-03-02 MED ORDER — SCOPOLAMINE 1 MG/3DAYS TD PT72: 1.0000 | MEDICATED_PATCH | TRANSDERMAL | 0 refills | Status: DC

## 2018-03-02 NOTE — Telephone Encounter (Addendum)
Pt left v/m that pt will be going on a cruise in couple of weeks and request motion sickness patches. Last acute visit 04/30/17. No recent annual exam. Unable to reach pt by phone to see how long pt will be on cruise.[facility].Please advise.  Pt called back and will be on cruise 4 days.

## 2018-03-02 NOTE — Addendum Note (Signed)
Addended by: Lorre Munroe on: 03/02/2018 06:45 PM   Modules accepted: Orders

## 2018-03-02 NOTE — Telephone Encounter (Signed)
RX sent to pharmacy per request 

## 2018-03-09 ENCOUNTER — Other Ambulatory Visit (HOSPITAL_COMMUNITY)
Admission: RE | Admit: 2018-03-09 | Discharge: 2018-03-09 | Disposition: A | Discharge status: Home / Self Care | Payer: BLUE CROSS/BLUE SHIELD | Source: Ambulatory Visit | Attending: Obstetrics and Gynecology | Admitting: Obstetrics and Gynecology

## 2018-03-09 ENCOUNTER — Encounter: Payer: Self-pay | Admitting: Obstetrics and Gynecology

## 2018-03-09 ENCOUNTER — Ambulatory Visit (INDEPENDENT_AMBULATORY_CARE_PROVIDER_SITE_OTHER): Payer: BLUE CROSS/BLUE SHIELD | Admitting: Obstetrics and Gynecology

## 2018-03-09 VITALS — BP 109/67 | HR 74 | Ht 67.0 in | Wt 136.7 lb

## 2018-03-09 DIAGNOSIS — Z01419 Encounter for gynecological examination (general) (routine) without abnormal findings: Secondary | ICD-10-CM | POA: Diagnosis not present

## 2018-03-09 DIAGNOSIS — N943 Premenstrual tension syndrome: Secondary | ICD-10-CM | POA: Diagnosis not present

## 2018-03-09 DIAGNOSIS — Z30431 Encounter for routine checking of intrauterine contraceptive device: Secondary | ICD-10-CM

## 2018-03-09 NOTE — Patient Instructions (Signed)
 Preventive Care 18-39 Years, Female Preventive care refers to lifestyle choices and visits with your health care provider that can promote health and wellness. What does preventive care include?   A yearly physical exam. This is also called an annual well check.  Dental exams once or twice a year.  Routine eye exams. Ask your health care provider how often you should have your eyes checked.  Personal lifestyle choices, including: ? Daily care of your teeth and gums. ? Regular physical activity. ? Eating a healthy diet. ? Avoiding tobacco and drug use. ? Limiting alcohol use. ? Practicing safe sex. ? Taking vitamin and mineral supplements as recommended by your health care provider. What happens during an annual well check? The services and screenings done by your health care provider during your annual well check will depend on your age, overall health, lifestyle risk factors, and family history of disease. Counseling Your health care provider may ask you questions about your:  Alcohol use.  Tobacco use.  Drug use.  Emotional well-being.  Home and relationship well-being.  Sexual activity.  Eating habits.  Work and work environment.  Method of birth control.  Menstrual cycle.  Pregnancy history. Screening You may have the following tests or measurements:  Height, weight, and BMI.  Diabetes screening. This is done by checking your blood sugar (glucose) after you have not eaten for a while (fasting).  Blood pressure.  Lipid and cholesterol levels. These may be checked every 5 years starting at age 20.  Skin check.  Hepatitis C blood test.  Hepatitis B blood test.  Sexually transmitted disease (STD) testing.  BRCA-related cancer screening. This may be done if you have a family history of breast, ovarian, tubal, or peritoneal cancers.  Pelvic exam and Pap test. This may be done every 3 years starting at age 21. Starting at age 30, this may be done  every 5 years if you have a Pap test in combination with an HPV test. Discuss your test results, treatment options, and if necessary, the need for more tests with your health care provider. Vaccines Your health care provider may recommend certain vaccines, such as:  Influenza vaccine. This is recommended every year.  Tetanus, diphtheria, and acellular pertussis (Tdap, Td) vaccine. You may need a Td booster every 10 years.  Varicella vaccine. You may need this if you have not been vaccinated.  HPV vaccine. If you are 26 or younger, you may need three doses over 6 months.  Measles, mumps, and rubella (MMR) vaccine. You may need at least one dose of MMR. You may also need a second dose.  Pneumococcal 13-valent conjugate (PCV13) vaccine. You may need this if you have certain conditions and were not previously vaccinated.  Pneumococcal polysaccharide (PPSV23) vaccine. You may need one or two doses if you smoke cigarettes or if you have certain conditions.  Meningococcal vaccine. One dose is recommended if you are age 19-21 years and a first-year college student living in a residence hall, or if you have one of several medical conditions. You may also need additional booster doses.  Hepatitis A vaccine. You may need this if you have certain conditions or if you travel or work in places where you may be exposed to hepatitis A.  Hepatitis B vaccine. You may need this if you have certain conditions or if you travel or work in places where you may be exposed to hepatitis B.  Haemophilus influenzae type b (Hib) vaccine. You may need this if   you have certain risk factors. Talk to your health care provider about which screenings and vaccines you need and how often you need them. This information is not intended to replace advice given to you by your health care provider. Make sure you discuss any questions you have with your health care provider. Document Released: 04/01/2001 Document Revised:  09/16/2016 Document Reviewed: 12/05/2014 Elsevier Interactive Patient Education  2019 Elsevier Inc.  

## 2018-03-09 NOTE — Progress Notes (Signed)
Subjective:   Hannah Mason is a 43 y.o. 93P2012 Caucasian female here for a routine well-woman exam.  No LMP recorded. (Menstrual status: IUD).    Current complaints: having headaches before menses. Irregular menses with IUD and increased  Moodiness.  PCP: Sampson SiBaity       does desire labs  Social History: Sexual: heterosexual Marital Status: married Living situation: with family Occupation: Geologist, engineeringteacher assistant at [employer] Tobacco/alcohol: no tobacco use Illicit drugs: no history of illicit drug use  The following portions of the patient's history were reviewed and updated as appropriate: allergies, current medications, past family history, past medical history, past social history, past surgical history and problem list.  Past Medical History Past Medical History:  Diagnosis Date  . ADHD   . Allergy    seasonal  . Fever blister   . Headache(784.0)    frequent  . Scoliosis     Past Surgical History Past Surgical History:  Procedure Laterality Date  . CESAREAN SECTION     1  . [facility]   Miscarrigage in 2011  . TUBAL LIGATION      Gynecologic History [id number]  No LMP recorded. (Menstrual status: IUD). Contraception: IUD and tubal ligation Last Pap: 2017. Results were: normal Last mammogram: 01/2017. Results were: normal   Obstetric History OB History  Gravida Para Term Preterm AB Living  3 2 2   1 2   SAB TAB Ectopic Multiple Live Births  1       2    # Outcome Date GA Lbr Len/2nd Weight Sex Delivery Anes PTL Lv  3 Term 2012   6 lb 14.4 oz (3.13 kg) F CS-LTranv   LIV  2 SAB 2010          1 Term 2007   6 lb 2.2 oz (2.785 kg) F CS-LTranv   LIV    Current Medications Current Outpatient Medications on File Prior to Visit  Medication Sig Dispense Refill  . valACYclovir (VALTREX) 1000 MG tablet Take 1 tablet (1,000 mg total) by mouth daily. 30 tablet 5  . benzonatate (TESSALON) 200 MG capsule Take 1 capsule  (200 mg total) by mouth 3 (three) times daily as needed for cough. (Patient not taking: Reported on 03/09/2018) 30 capsule 0  . chlorpheniramine-HYDROcodone (TUSSIONEX PENNKINETIC ER) 10-8 MG/5ML SUER Take 5 mLs by mouth every 12 (twelve) hours as needed for cough. (Patient not taking: Reported on 03/09/2018) 60 mL 0  . fluticasone (FLONASE) 50 MCG/ACT nasal spray Place 2 sprays into both nostrils daily. (Patient not taking: Reported on 03/09/2018) 16 g 0  . levonorgestrel (MIRENA) 20 MCG/24HR IUD 1 each by Intrauterine route once.    Marland Kitchen. scopolamine (TRANSDERM-SCOP) 1 MG/3DAYS Place 1 patch (1.5 mg total) onto the skin every 3 (three) days. (Patient not taking: Reported on 03/09/2018) 2 patch 0   No current facility-administered medications on file prior to visit.     Review of Systems Patient denies any headaches, blurred vision, shortness of breath, chest pain, abdominal pain, problems with bowel movements, urination, or intercourse.  Objective:  BP 109/67   Pulse 74   Ht 5\' 7"  (1.702 m)   Wt 136 lb 11.2 oz (62 kg)   BMI 21.41 kg/m  Physical Exam  General:  Well developed, well nourished, no acute distress. She is alert and oriented x3. Skin:  Warm and dry Neck:  Midline trachea, no thyromegaly or nodules Cardiovascular: Regular  rate and rhythm, no murmur heard Lungs:  Effort normal, all lung fields clear to auscultation bilaterally Breasts:  No dominant palpable mass, retraction, or nipple discharge Abdomen:  Soft, non tender, no hepatosplenomegaly or masses Pelvic:  External genitalia is normal in appearance.  The vagina is normal in appearance. The cervix is bulbous, no CMT.  Thin prep pap is done with HR HPV cotesting. Uterus is felt to be normal size, shape, and contour.  No adnexal masses or tenderness noted. IUD string not seen Extremities:  No swelling or varicosities noted Psych:  She has a normal mood and affect  Assessment:   Healthy well-woman exam IUD check (due to be  changed next year) Menstrual headaches Need flu vaccine  Plan:  Labs obtained-will follow up accordingly Recommend motrin with caffeine as needed for menstrual headaches. Decline flu vaccine as she has a cruise next week. F/U 1 year for AE, or sooner if needed Mammogram ordered  Oneka Parada Suzan Nailer, CNM

## 2018-03-10 ENCOUNTER — Other Ambulatory Visit: Payer: Self-pay | Admitting: Obstetrics and Gynecology

## 2018-03-10 ENCOUNTER — Telehealth: Payer: Self-pay | Admitting: Obstetrics and Gynecology

## 2018-03-10 ENCOUNTER — Telehealth: Payer: Self-pay | Admitting: *Deleted

## 2018-03-10 DIAGNOSIS — E559 Vitamin D deficiency, unspecified: Secondary | ICD-10-CM

## 2018-03-10 LAB — LIPID PANEL
CHOL/HDL RATIO: 2.1 ratio (ref 0.0–4.4)
Cholesterol, Total: 126 mg/dL (ref 100–199)
HDL: 59 mg/dL (ref >39)
LDL CALC: 47 mg/dL (ref 0–99)
Triglycerides: 99 mg/dL (ref 0–149)
VLDL Cholesterol Cal: 20 mg/dL (ref 5–40)

## 2018-03-10 LAB — COMPREHENSIVE METABOLIC PANEL
ALT: 14 IU/L (ref 0–32)
AST: 16 IU/L (ref 0–40)
Albumin/Globulin Ratio: 1.9 (ref 1.2–2.2)
Albumin: 4.4 g/dL (ref 3.8–4.8)
Alkaline Phosphatase: 56 IU/L (ref 39–117)
BUN/Creatinine Ratio: 23 (ref 9–23)
BUN: 19 mg/dL (ref 6–24)
Bilirubin Total: 0.3 mg/dL (ref 0.0–1.2)
CALCIUM: 9.3 mg/dL (ref 8.7–10.2)
CHLORIDE: 103 mmol/L (ref 96–106)
CO2: 23 mmol/L (ref 20–29)
Creatinine, Ser: 0.84 mg/dL (ref 0.57–1.00)
GFR, EST AFRICAN AMERICAN: 100 mL/min/{1.73_m2} (ref >59)
GFR, EST NON AFRICAN AMERICAN: 87 mL/min/{1.73_m2} (ref >59)
GLUCOSE: 74 mg/dL (ref 65–99)
Globulin, Total: 2.3 g/dL (ref 1.5–4.5)
Potassium: 3.7 mmol/L (ref 3.5–5.2)
Sodium: 139 mmol/L (ref 134–144)
TOTAL PROTEIN: 6.7 g/dL (ref 6.0–8.5)

## 2018-03-10 LAB — CBC
HEMATOCRIT: 37.6 % (ref 34.0–46.6)
HEMOGLOBIN: 12.6 g/dL (ref 11.1–15.9)
MCH: 30.4 pg (ref 26.6–33.0)
MCHC: 33.5 g/dL (ref 31.5–35.7)
MCV: 91 fL (ref 79–97)
Platelets: 281 10*3/uL (ref 150–450)
RBC: 4.15 x10E6/uL (ref 3.77–5.28)
RDW: 12.9 % (ref 11.7–15.4)
WBC: 7.5 10*3/uL (ref 3.4–10.8)

## 2018-03-10 LAB — TSH: TSH: 1.51 u[IU]/mL (ref 0.450–4.500)

## 2018-03-10 LAB — VITAMIN D 25 HYDROXY (VIT D DEFICIENCY, FRACTURES): Vit D, 25-Hydroxy: 25.5 ng/mL — ABNORMAL LOW (ref 30.0–100.0)

## 2018-03-10 MED ORDER — VITAMIN D (ERGOCALCIFEROL) 1.25 MG (50000 UNIT) PO CAPS: 50000.0000 [IU] | ORAL_CAPSULE | ORAL | 1 refills | Status: DC

## 2018-03-10 NOTE — Telephone Encounter (Signed)
Mailed all info to pt 

## 2018-03-10 NOTE — Telephone Encounter (Signed)
The patient called and stated that she would like a nurse to call her back in regards to her not knowing why a prescription was sent int her pharmacy for her. The patient is hoping to get clarification today if possible, No other information disclosed. Please advise.

## 2018-03-10 NOTE — Telephone Encounter (Signed)
Spoke with patient, states she received text message from [facility] saying her prescription was ready.  Explained to patient that her Vit D level was low and [facility] recommends taking Vit D weekly dose to help boost back up.  Patient verbalized understanding.

## 2018-03-10 NOTE — Telephone Encounter (Signed)
-----   Message from Purcell Nails, [place] sent at 03/10/2018 11:11 AM EST ----- Please mail info on vit D def.

## 2018-03-15 LAB — CYTOLOGY - PAP
Diagnosis: NEGATIVE
HPV: NOT DETECTED

## 2018-03-23 DIAGNOSIS — H5789 Other specified disorders of eye and adnexa: Secondary | ICD-10-CM | POA: Diagnosis not present

## 2018-05-11 ENCOUNTER — Ambulatory Visit (INDEPENDENT_AMBULATORY_CARE_PROVIDER_SITE_OTHER): Payer: BLUE CROSS/BLUE SHIELD | Admitting: Internal Medicine

## 2018-05-11 ENCOUNTER — Encounter: Payer: Self-pay | Admitting: Internal Medicine

## 2018-05-11 ENCOUNTER — Other Ambulatory Visit: Payer: Self-pay

## 2018-05-11 DIAGNOSIS — F411 Generalized anxiety disorder: Secondary | ICD-10-CM

## 2018-05-11 MED ORDER — CITALOPRAM HYDROBROMIDE 10 MG PO TABS: 10.0000 mg | ORAL_TABLET | Freq: Every day | ORAL | 2 refills | Status: DC

## 2018-05-11 NOTE — Progress Notes (Signed)
Virtual Visit via Telephone Note  I connected with Hannah Mason on 05/11/18 at 11:15 AM EDT by telephone and verified that I am speaking with the correct person using two identifiers.   I discussed the limitations, risks, security and privacy concerns of performing an evaluation and management service by telephone and the availability of in person appointments. I also discussed with the patient that there may be a patient responsible charge related to this service. The patient expressed understanding and agreed to proceed.   History of Present Illness: Pt reports anxiety. Diagnosed many years ago. Has been able to manage with talking with friends, exercise, occassional meeting with therapist. Now, since the pandemic has hit, she is not working, not getting paid. She is worrying about her finances. She denies feelings of depression, SI/HI. She has taken Celexa in the past but doesn't remember if it worked for her.    Observations/Objective: Alert and oriented. Judgement and thought content appear normal.    Assessment and Plan:   GAD:  Support offered today Discussed treatment with SSRI therapy, side effects, benefits, risks Will trial Celexa 10 mg as she has taken in the past She will try to meet with her therapist as often as possible. Advised her to follow up with me in 1 month, via phone visit or mychart  Follow Up Instructions:    I discussed the assessment and treatment plan with the patient. The patient was provided an opportunity to ask questions and all were answered. The patient agreed with the plan and demonstrated an understanding of the instructions.   The patient was advised to call back or seek an in-person evaluation if the symptoms worsen or if the condition fails to improve as anticipated.  I provided 15 minutes of non-face-to-face time during this encounter.   Nicki Reaper, NP

## 2018-05-11 NOTE — Patient Instructions (Signed)

## 2018-09-27 DIAGNOSIS — L820 Inflamed seborrheic keratosis: Secondary | ICD-10-CM | POA: Diagnosis not present

## 2018-09-27 DIAGNOSIS — L298 Other pruritus: Secondary | ICD-10-CM | POA: Diagnosis not present

## 2018-09-27 DIAGNOSIS — L853 Xerosis cutis: Secondary | ICD-10-CM | POA: Diagnosis not present

## 2018-10-19 ENCOUNTER — Telehealth: Payer: Self-pay | Admitting: Obstetrics and Gynecology

## 2018-10-19 NOTE — Telephone Encounter (Signed)
The patient called and stated that for the last 6 months before her cycle starts she suffers from severe nausea and fatigue, and along with terrible migraines. The patient is requesting a call back to ask a few questions. Please advise.

## 2018-10-20 NOTE — Telephone Encounter (Signed)
Have her come in to have it replaced(if she desires another one) or to discuss changing BC as either should get rid of symptoms. Let her know I have replaced other Mirenas for the same reasons and placing a new one typically works.

## 2018-10-20 NOTE — Telephone Encounter (Signed)
Pt states for last 6 months about 1 week before her period(has Mirena), she gets very nauseated, very crampy and massive headaches where she has to go to bed, IUD has been in 5 years in Oct, pls advise

## 2018-10-22 NOTE — Telephone Encounter (Signed)
I would have her scheduled for both, but feel fine attempting replacement first and then ultrasound to check it afterwards.

## 2018-10-22 NOTE — Telephone Encounter (Signed)
In the past Dr. Keturah Barre put in [name], she had to have [name] and xray, should I schedule her for iud [name], then placement?? Hannah Mason

## 2018-10-27 NOTE — Telephone Encounter (Signed)
appt was made

## 2018-10-29 DIAGNOSIS — M955 Acquired deformity of pelvis: Secondary | ICD-10-CM | POA: Diagnosis not present

## 2018-10-29 DIAGNOSIS — M9903 Segmental and somatic dysfunction of lumbar region: Secondary | ICD-10-CM | POA: Diagnosis not present

## 2018-10-29 DIAGNOSIS — M5441 Lumbago with sciatica, right side: Secondary | ICD-10-CM | POA: Diagnosis not present

## 2018-10-29 DIAGNOSIS — M9905 Segmental and somatic dysfunction of pelvic region: Secondary | ICD-10-CM | POA: Diagnosis not present

## 2018-11-01 DIAGNOSIS — M9905 Segmental and somatic dysfunction of pelvic region: Secondary | ICD-10-CM | POA: Diagnosis not present

## 2018-11-01 DIAGNOSIS — M5441 Lumbago with sciatica, right side: Secondary | ICD-10-CM | POA: Diagnosis not present

## 2018-11-01 DIAGNOSIS — M9903 Segmental and somatic dysfunction of lumbar region: Secondary | ICD-10-CM | POA: Diagnosis not present

## 2018-11-01 DIAGNOSIS — M955 Acquired deformity of pelvis: Secondary | ICD-10-CM | POA: Diagnosis not present

## 2018-11-03 DIAGNOSIS — M9905 Segmental and somatic dysfunction of pelvic region: Secondary | ICD-10-CM | POA: Diagnosis not present

## 2018-11-03 DIAGNOSIS — M5441 Lumbago with sciatica, right side: Secondary | ICD-10-CM | POA: Diagnosis not present

## 2018-11-03 DIAGNOSIS — M9903 Segmental and somatic dysfunction of lumbar region: Secondary | ICD-10-CM | POA: Diagnosis not present

## 2018-11-03 DIAGNOSIS — M955 Acquired deformity of pelvis: Secondary | ICD-10-CM | POA: Diagnosis not present

## 2018-11-08 ENCOUNTER — Telehealth: Payer: Self-pay

## 2018-11-08 NOTE — Telephone Encounter (Signed)
Pt last had visit on 05/11/18 and Celexa 10 mg trial was done; pt was to meet with therapist ASAP and FU with phone visit or virtual in 1 mth. Pt said the Celexa 10 mg was not effective;No SI/HI; due to covid has not met with therapist yet and did not do FU appt. Pt scheduled FU virtual visit on 11/10/18 at 3:30 PM. Pt to cb sooner if needed and UC & ED precautions given and pt voiced understanding.FYI to Avie Echevaria NP.

## 2018-11-09 NOTE — Telephone Encounter (Signed)
noted 

## 2018-11-10 ENCOUNTER — Ambulatory Visit (INDEPENDENT_AMBULATORY_CARE_PROVIDER_SITE_OTHER): Payer: BC Managed Care – PPO | Admitting: Internal Medicine

## 2018-11-10 ENCOUNTER — Encounter: Payer: Self-pay | Admitting: Internal Medicine

## 2018-11-10 DIAGNOSIS — F411 Generalized anxiety disorder: Secondary | ICD-10-CM

## 2018-11-10 MED ORDER — LORAZEPAM 0.5 MG PO TABS: 0.5000 mg | ORAL_TABLET | Freq: Every day | ORAL | 0 refills | Status: DC | PRN

## 2018-11-10 NOTE — Progress Notes (Signed)
Virtual Visit via Video Note  I connected with Hannah Mason on 11/10/18 at  3:30 PM EDT by a video enabled telemedicine application and verified that I am speaking with the correct person using two identifiers.  Location: Patient: Home Provider: Office   I discussed the limitations of evaluation and management by telemedicine and the availability of in person appointments. The patient expressed understanding and agreed to proceed.  History of Present Illness:  Pt reports persistent anxiety. 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 She is also taking care of her parents whose health is declining. Her mother has Alzheimer's and has refused treatment. She feels nervous, anxious and on edge. She has trouble focusing, trouble sleeping. She has taken Citalopram in the past but did not feel like it was effective. She reports she does not feel like this every day but intermittently . She is not currently seeing a therapist. She denies depression. She has a history of ADD but is not currently taking anything for this.   Past Medical History:  Diagnosis Date  . ADHD   . Allergy    seasonal  . Fever blister   . Headache(784.0)    frequent  . Scoliosis     Current Outpatient Medications  Medication Sig Dispense Refill  . levonorgestrel (MIRENA) 20 MCG/24HR IUD 1 each by Intrauterine route once.    . valACYclovir (VALTREX) 1000 MG tablet Take 1 tablet (1,000 mg total) by mouth daily. 30 tablet 5  . LORazepam (ATIVAN) 0.5 MG tablet Take 1 tablet (0.5 mg total) by mouth daily as needed for anxiety. 20 tablet 0   No current facility-administered medications for this visit.     No Known Allergies  Family History  Problem Relation Age of Onset  . Diabetes Father   . Hypertension Father   . Breast cancer Neg Hx   . Ovarian cancer Neg Hx   . Colon cancer Neg Hx   . Heart  disease Neg Hx     Social History   Socioeconomic History  . Marital status: Married    Spouse name: Not on file  . Number of children: Not on file  . Years of education: Not on file  . Highest education level: Not on file  Occupational History  . Not on file  Social Needs  . Financial resource strain: Not on file  . Food insecurity    Worry: Not on file    Inability: Not on file  . Transportation needs    Medical: Not on file    Non-medical: Not on file  Tobacco Use  . Smoking status: Former Smoker    Quit date: 12/17/2005    Years since quitting: 12.9  . Smokeless tobacco: Never Used  Substance and Sexual Activity  . Alcohol use: No  . Drug use: No  . Sexual activity: Yes    Birth control/protection: I.U.D.  Lifestyle  . Physical activity    Days per week: 6 days    Minutes per session: 60 min  . Stress: Not at all  Relationships  . Social Herbalist on phone: Not on file    Gets together: Not on file    Attends religious service: Not on file    Active member of club or organization: Not on file    Attends meetings of clubs or  organizations: Not on file    Relationship status: Not on file  . Intimate partner violence    Fear of current or ex partner: No    Emotionally abused: No    Physically abused: No    Forced sexual activity: No  Other Topics Concern  . Not on file  Social History Narrative   Lives with husband in [place], 4 children in home (14,12,5,21months). 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     Constitutional: Denies fever, malaise, fatigue, headache or abrupt weight changes.  Respiratory: Denies difficulty breathing, shortness of breath, cough or sputum production.   Cardiovascular: Denies chest pain, chest tightness, palpitations or swelling in the hands or feet.  Neurological: Pt reports difficulty focusing, insomnia. Denies dizziness, difficulty with memory, difficulty with speech or problems with balance and coordination.  Psych: Pt reports anxiety.  Denies depression, SI/HI.  No other specific complaints in a complete review of systems (except as listed in HPI above).    Observations/Objective:  Wt Readings from Last 3 Encounters:  03/09/18 136 lb 11.2 oz (62 kg)  12/28/17 133 lb (60.3 kg)  04/30/17 137 lb 8 oz (62.4 kg)    General: Appears her  stated age, well developed, well nourished in NAD. Pulmonary/Chest: Normal effort. No respiratory distress.  Neurological: Alert and oriented.  Psychiatric: Mood and affect anxious appearing. Behavior is normal. Judgment and thought content normal.    BMET    Component Value Date/Time   NA 139 03/09/2018 1430   NA 139 11/15/2011 0748   K 3.7 03/09/2018 1430   K 3.7 11/15/2011 0748   CL 103 03/09/2018 1430   CL 107 11/15/2011 0748   CO2 23 03/09/2018 1430   CO2 24 11/15/2011 0748   GLUCOSE 74 03/09/2018 1430   GLUCOSE 103 (H) 11/15/2011 0748   BUN 19 03/09/2018 1430   BUN 10 11/15/2011 0748   CREATININE 0.84 03/09/2018 1430   CREATININE 0.55 (L) 11/15/2011 0748   CALCIUM 9.3 03/09/2018 1430   CALCIUM 9.1 11/15/2011 0748   GFRNONAA 87 03/09/2018 1430   GFRNONAA >60 11/15/2011 0748   GFRAA 100 03/09/2018 1430   GFRAA >60 11/15/2011 0748    Lipid Panel     Component Value Date/Time   CHOL 126 03/09/2018 1430   TRIG 99 03/09/2018 1430   HDL 59 03/09/2018 1430   CHOLHDL 2.1 03/09/2018 1430   CHOLHDL 2 01/20/2011 1444   VLDL 11.6 01/20/2011 1444   LDLCALC 47 03/09/2018 1430    CBC    Component Value Date/Time   WBC 7.5 03/09/2018 1430   WBC 16.9 (H) 11/15/2011 0748   WBC 11.4 (H) 01/20/2011 1511   RBC 4.15 03/09/2018 1430   RBC 4.16 11/15/2011 0748   RBC 4.37 01/20/2011 1511   HGB 12.6 03/09/2018 1430   HCT 37.6 03/09/2018 1430   PLT 281 03/09/2018 1430   MCV 91 03/09/2018 1430   MCV 87 11/15/2011 0748   MCH 30.4 03/09/2018 1430   MCH 28.9 11/15/2011 0748   MCH 27.2 01/20/2011 1511   MCHC 33.5 03/09/2018 1430   MCHC 33.1 11/15/2011 0748   MCHC 31.7  01/20/2011 1511   RDW 12.9 03/09/2018 1430   RDW 14.4 11/15/2011 0748   LYMPHSABS 0.5 (L) 11/15/2011 0748   MONOABS 1.3 (H) 11/15/2011 0748   EOSABS 0.0 11/15/2011 0748   BASOSABS 0.1 11/15/2011 0748    Hgb A1C Lab Results  Component Value Date   HGBA1C 5.2 06/18/2016       Assessment  and Plan:  Anxiety:  Deteriorated due to situational stress Support offered today D/C Citalopram RX for Ativan 0.5 mg daily prn- sedation and addiction caution given Will get CSA and UDS at next visit She declines referral for psychotherapy at this time Will monitor   Follow Up Instructions:    I discussed the assessment and treatment plan with the patient. The patient was provided an opportunity to ask questions and all were answered. The patient agreed with the plan and demonstrated an understanding of the instructions.   The patient was advised to call back or seek an in-person evaluation if the symptoms worsen or if the condition fails to improve as anticipated.    Nicki Reaper, NP

## 2018-11-10 NOTE — Patient Instructions (Signed)

## 2018-11-24 ENCOUNTER — Encounter: Payer: Self-pay | Admitting: Obstetrics and Gynecology

## 2018-12-01 DIAGNOSIS — M5441 Lumbago with sciatica, right side: Secondary | ICD-10-CM | POA: Diagnosis not present

## 2018-12-01 DIAGNOSIS — M9903 Segmental and somatic dysfunction of lumbar region: Secondary | ICD-10-CM | POA: Diagnosis not present

## 2018-12-01 DIAGNOSIS — M9905 Segmental and somatic dysfunction of pelvic region: Secondary | ICD-10-CM | POA: Diagnosis not present

## 2018-12-01 DIAGNOSIS — M955 Acquired deformity of pelvis: Secondary | ICD-10-CM | POA: Diagnosis not present

## 2018-12-08 ENCOUNTER — Other Ambulatory Visit: Payer: Self-pay

## 2018-12-08 ENCOUNTER — Ambulatory Visit (INDEPENDENT_AMBULATORY_CARE_PROVIDER_SITE_OTHER): Payer: BC Managed Care – PPO | Admitting: Obstetrics and Gynecology

## 2018-12-08 ENCOUNTER — Encounter: Payer: Self-pay | Admitting: Obstetrics and Gynecology

## 2018-12-08 VITALS — BP 107/64 | HR 75 | Ht 67.0 in | Wt 138.2 lb

## 2018-12-08 DIAGNOSIS — Z538 Procedure and treatment not carried out for other reasons: Secondary | ICD-10-CM

## 2018-12-08 DIAGNOSIS — Z975 Presence of (intrauterine) contraceptive device: Secondary | ICD-10-CM | POA: Diagnosis not present

## 2018-12-08 NOTE — Progress Notes (Signed)
Patient is here for an Mirena IUD removal and reinsertion. Informed consent obtained. Refused flu shot.

## 2018-12-08 NOTE — Progress Notes (Signed)
Hannah Mason is a 43 y.o. year old G26P2012 Caucasian female who presents for removal and replacement of a Mirena IUD. She was given informed consent for removal and reinsertion of her Mirena. Her Mirena was placed 2015, Patient's last menstrual period was 11/19/2018 (approximate)., and her pregnancy test today was negative.   The risks and benefits of the method and placement have been thouroughly reviewed with the patient and all questions were answered.  Specifically the patient is aware of failure rate of 02/998, expulsion of the IUD and of possible perforation.  The patient is aware of irregular bleeding due to the method and understands the incidence of irregular bleeding diminishes with time.  Signed copy of informed consent in chart.   Patient's last menstrual period was 11/19/2018 (approximate). BP 107/64   Pulse 75   Ht 5\' 7"  (1.702 m)   Wt 138 lb 4 oz (62.7 kg)   LMP 11/19/2018 (Approximate)   BMI 21.65 kg/m  No results found for this or any previous visit (from the past 24 hour(s)).   Appropriate time out taken. A graves speculum was placed in the vagina.  The cervix was visualized, prepped using Betadine. The strings were not visible. Attempted to grasp  the Mirena strings inside the cervix and IUD hook also attempted, but not successful. Severe cramping noted and procedure stopped.   Will leave Mirena in place for now and patient will call office to schedule consult with one of physicians for removal with possible replacement under sedation.  600mg  motrin given while here in office.   [name] Rockney Ghee, CNM

## 2019-01-03 ENCOUNTER — Telehealth: Payer: Self-pay | Admitting: Obstetrics and Gynecology

## 2019-01-03 ENCOUNTER — Other Ambulatory Visit: Payer: Self-pay | Admitting: Internal Medicine

## 2019-01-03 DIAGNOSIS — Z1231 Encounter for screening mammogram for malignant neoplasm of breast: Secondary | ICD-10-CM

## 2019-01-03 NOTE — Telephone Encounter (Signed)
Per [name]'s note, she needs to be scheduled with one of the MDs to have a pre-op appointment and schedule choose a date for her surgery.   Dr Marcelline Mates

## 2019-01-03 NOTE — Telephone Encounter (Signed)
The patient called and stated that she was to be scheduled for a surgical procedure to have her IUD removed. Pt is wanting to follow up and find out what she needs to do next. Please advise.

## 2019-01-05 NOTE — Telephone Encounter (Signed)
Pt was called and scheduled for pre-op.   Thank you  TH

## 2019-01-06 ENCOUNTER — Telehealth: Payer: Self-pay

## 2019-01-06 NOTE — Telephone Encounter (Signed)
Pt wants to have the surgery the week after christmas if possible. If not, then she will have to wait until January.

## 2019-01-11 NOTE — Telephone Encounter (Signed)
Pt aware 12/28 is available. Pt is good with that date.   Will send to [facility] to confirm. Pt aware it will be next week before [facility] responds.

## 2019-01-11 NOTE — Telephone Encounter (Signed)
Please advise. Thanks Saavi Mceachron 

## 2019-01-12 NOTE — Telephone Encounter (Signed)
This date should be fine.

## 2019-01-18 ENCOUNTER — Ambulatory Visit
Admission: RE | Admit: 2019-01-18 | Discharge: 2019-01-18 | Disposition: A | Discharge status: Home / Self Care | Payer: BC Managed Care – PPO | Source: Ambulatory Visit | Attending: Internal Medicine | Admitting: Internal Medicine

## 2019-01-18 ENCOUNTER — Other Ambulatory Visit: Payer: Self-pay | Admitting: Internal Medicine

## 2019-01-18 DIAGNOSIS — N6489 Other specified disorders of breast: Secondary | ICD-10-CM

## 2019-01-18 DIAGNOSIS — Z1231 Encounter for screening mammogram for malignant neoplasm of breast: Secondary | ICD-10-CM | POA: Insufficient documentation

## 2019-01-18 DIAGNOSIS — R928 Other abnormal and inconclusive findings on diagnostic imaging of breast: Secondary | ICD-10-CM

## 2019-01-19 NOTE — Telephone Encounter (Signed)
Pt aware ASC good with 12/28. She has been placed on the OR schedule.   Reminded of Pre-op in 12/8.

## 2019-01-25 ENCOUNTER — Other Ambulatory Visit: Payer: Self-pay

## 2019-01-25 ENCOUNTER — Ambulatory Visit (INDEPENDENT_AMBULATORY_CARE_PROVIDER_SITE_OTHER): Payer: BC Managed Care – PPO | Admitting: Obstetrics and Gynecology

## 2019-01-25 ENCOUNTER — Encounter: Payer: Self-pay | Admitting: Obstetrics and Gynecology

## 2019-01-25 VITALS — BP 112/65 | HR 67 | Ht 67.0 in | Wt 140.2 lb

## 2019-01-25 DIAGNOSIS — T8332XD Displacement of intrauterine contraceptive device, subsequent encounter: Secondary | ICD-10-CM | POA: Diagnosis not present

## 2019-01-25 DIAGNOSIS — Z01818 Encounter for other preprocedural examination: Secondary | ICD-10-CM

## 2019-01-25 DIAGNOSIS — Z9851 Tubal ligation status: Secondary | ICD-10-CM | POA: Diagnosis not present

## 2019-01-25 DIAGNOSIS — Z8742 Personal history of other diseases of the female genital tract: Secondary | ICD-10-CM

## 2019-01-25 NOTE — Progress Notes (Signed)
GYNECOLOGY PROGRESS NOTE  Subjective:    Patient ID: Hannah Mason, female    DOB: [birth year], 43 y.o.   MRN: [id number]  HPI  Patient is a 43 y.o. [id number] female who presents for pre-operative exam for lost IUD threads. She was a patient of Hannah Mason ,CNM.  She currently denies any major complaints today.  She has a prior history of tubal ligation. IUD was used mainly for management of PMS and heavy menses.   The following portions of the patient's history were reviewed and updated as appropriate:   She  has a past medical history of ADHD, Allergy, Fever blister, Headache(784.0), and Scoliosis.   She  has a past surgical history that includes DNC (2011); Cesarean section; Tubal ligation; and Dilation and curettage of uterus.   Her family history includes Diabetes in her father; Hypertension in her father.   She  reports that she quit smoking about 13 years ago. She has never used smokeless tobacco. She reports current alcohol use. She reports that she does not use drugs.   Current Outpatient Medications on File Prior to Visit  Medication Sig Dispense Refill   levonorgestrel (MIRENA) 20 MCG/24HR IUD 1 each by Intrauterine route once.     LORazepam (ATIVAN) 0.5 MG tablet Take 1 tablet (0.5 mg total) by mouth daily as needed for anxiety. 20 tablet 0   valACYclovir (VALTREX) 1000 MG tablet Take 1 tablet (1,000 mg total) by mouth daily. 30 tablet 5   Vitamin D, Ergocalciferol, (DRISDOL) 1.25 MG (50000 UT) CAPS capsule TK ONE C PO Q 7 DAYS     No current facility-administered medications on file prior to visit.    She has No Known Allergies..  Review of Systems Pertinent items noted in HPI and remainder of comprehensive ROS otherwise negative.   Objective:   Blood pressure 112/65, pulse 67, height 5\' 7"  (1.702 m), weight 140 lb 3.2 oz (63.6 kg). General appearance: alert and no distress Abdomen: soft, non-tender; bowel sounds normal; no masses,  no organomegaly Pelvic:  deferred. See H&P for details.    Assessment:   Preoperative examination. Lost IUD threads History of menorrhagia History of tubal ligation  Plan:   Patient is scheduled for surgical management of hysteroscopic IUD removal. On further discussion of plans for management of cycles after IUD removal for management of her history of heavy periods, patient initially unsure. Discussed options of medical management with OCPs, NuvaRing, Depo Provera, Lysteda, or surgical management with endometrial ablation, or definitive option of hysterectomy.  On further discussion, patient notes she would like to have an endometrial ablation.  The risks of both surgeries were discussed in detail with the patient including but not limited to: bleeding which may require transfusion or reoperation; infection which may require prolonged hospitalization or re-hospitalization and antibiotic therapy; injury to bowel, bladder, and major vessels or other surrounding organs; need for additional procedures including laparotomy; thromboembolic phenomenon, incisional problems and other postoperative or anesthesia complications.  Patient was told that the likelihood that her condition and symptoms will be treated effectively with this surgical management was very high; the postoperative expectations were also discussed in detail. The patient also understands the alternative treatment options which were discussed in full. All questions were answered.  She was told that she will be contacted by our surgical scheduler regarding the time and date of her surgery; routine preoperative instructions will be given to her by the preoperative nursing team.   She is aware of need  for preoperative COVID testing and subsequent quarantine from time of test to time of surgery; she will be given further preoperative instructions at that [place] screening visit. Printed patient education handouts about the procedure were given to the patient to review at  home.  Surgery scheduled for 02/14/2019.    A total of 15 minutes were spent face-to-face with the patient during this encounter and over half of that time dealt with counseling and coordination of care.  Rubie Maid, MD [facility]

## 2019-01-25 NOTE — H&P (Signed)
  GYNECOLOGY PREOPERATIVE HISTORY AND PHYSICAL   Subjective:  Hannah Mason is a 43 y.o. G3P2012 here for surgical management of lost IUD threads, history of menorrhagia. No significant preoperative concerns.  Proposed surgery: Hysteroscopic IUD retrieval, Dilation and Curettage, Endometrial Ablation   Pertinent Gynecological History: Menses: flow is light to moderate, lasts 3-4 days. Cycles are irregular.  Contraception: Mirena IUD (used for management of menses) and tubal ligation  Last mammogram: abnormal: BIRADS 0; assymetry of left breast. Date: 01/18/2019 Last pap: normal Date: 03/09/2018   Past Medical History:  Diagnosis Date  . ADHD   . Allergy    seasonal  . Fever blister   . Headache(784.0)    frequent  . Scoliosis     Past Surgical History:  Procedure Laterality Date  . CESAREAN SECTION     1  . DILATION AND CURETTAGE OF UTERUS    . DNC  2011   Miscarrigage in 2011  . TUBAL LIGATION      OB History  Gravida Para Term Preterm AB Living  3 2 2   1 2  SAB TAB Ectopic Multiple Live Births  1       2    # Outcome Date GA Lbr Len/2nd Weight Sex Delivery Anes PTL Lv  3 Term 2012   6 lb 14.4 oz (3.13 kg) F CS-LTranv   LIV  2 SAB 2010          1 Term 2007   6 lb 2.2 oz (2.785 kg) F CS-LTranv   LIV    Family History  Problem Relation Age of Onset  . Diabetes Father   . Hypertension Father   . Breast cancer Neg Hx   . Ovarian cancer Neg Hx   . Colon cancer Neg Hx   . Heart disease Neg Hx     Social History   Socioeconomic History  . Marital status: Married    Spouse name: Not on file  . Number of children: Not on file  . Years of education: Not on file  . Highest education level: Not on file  Occupational History  . Not on file  Social Needs  . Financial resource strain: Not on file  . Food insecurity    Worry: Not on file    Inability: Not on file  . Transportation needs    Medical: Not on file    Non-medical: Not on file  Tobacco Use    . Smoking status: Former Smoker    Quit date: 12/17/2005    Years since quitting: 13.1  . Smokeless tobacco: Never Used  Substance and Sexual Activity  . Alcohol use: Yes    Comment: occass  . Drug use: No  . Sexual activity: Yes    Birth control/protection: I.U.D.  Lifestyle  . Physical activity    Days per week: 6 days    Minutes per session: 60 min  . Stress: Not at all  Relationships  . Social connections    Talks on phone: Not on file    Gets together: Not on file    Attends religious service: Not on file    Active member of club or organization: Not on file    Attends meetings of clubs or organizations: Not on file    Relationship status: Not on file  . Intimate partner violence    Fear of current or ex partner: No    Emotionally abused: No    Physically abused: No      Forced sexual activity: No  Other Topics Concern  . Not on file  Social History Narrative   Lives with husband in [place], 4 children in home (14,12,5,5months). No pets. Work [employer].    Current Outpatient Medications on File Prior to Visit  Medication Sig Dispense Refill  . levonorgestrel (MIRENA) 20 MCG/24HR IUD 1 each by Intrauterine route once.    . LORazepam (ATIVAN) 0.5 MG tablet Take 1 tablet (0.5 mg total) by mouth daily as needed for anxiety. 20 tablet 0  . valACYclovir (VALTREX) 1000 MG tablet Take 1 tablet (1,000 mg total) by mouth daily. 30 tablet 5  . Vitamin D, Ergocalciferol, (DRISDOL) 1.25 MG (50000 UT) CAPS capsule TK ONE C PO Q 7 DAYS     No current facility-administered medications on file prior to visit.    No Known Allergies   Review of Systems Constitutional: No recent fever/chills/sweats Respiratory: No recent cough/bronchitis Cardiovascular: No chest pain Gastrointestinal: No recent nausea/vomiting/diarrhea Genitourinary: No UTI symptoms Hematologic/lymphatic:No history of coagulopathy or recent blood thinner use    Objective:   Blood pressure 112/65, pulse 67, height 5'  7" (1.702 m), weight 140 lb 3.2 oz (63.6 kg). CONSTITUTIONAL: Well-developed, well-nourished female in no acute distress.  HENT:  Normocephalic, atraumatic, External right and left ear normal. Oropharynx is clear and moist EYES: Conjunctivae and EOM are normal. Pupils are equal, round, and reactive to light. No scleral icterus.  NECK: Normal range of motion, supple, no masses SKIN: Skin is warm and dry. No rash noted. Not diaphoretic. No erythema. No pallor. NEUROLOGIC: Alert and oriented to person, place, and time. Normal reflexes, muscle tone coordination. No cranial nerve deficit noted. PSYCHIATRIC: Normal mood and affect. Normal behavior. Normal judgment and thought content. CARDIOVASCULAR: Normal heart rate noted, regular rhythm RESPIRATORY: Effort and breath sounds normal, no problems with respiration noted ABDOMEN: Soft, nontender, nondistended. PELVIC: Deferred MUSCULOSKELETAL: Normal range of motion. No edema and no tenderness. 2+ distal pulses.    Labs: Lab Results  Component Value Date   WBC 7.5 03/09/2018   HGB 12.6 03/09/2018   HCT 37.6 03/09/2018   MCV 91 03/09/2018   PLT 281 03/09/2018    Imaging Studies: None  Assessment:    Preop examination  Intrauterine contraceptive device threads lost, subsequent encounter  History of menorrhagia  History of tubal ligation   Plan:    Counseling: Procedure, risks, reasons, benefits and complications (including injury to bowel, bladder, major blood vessel, ureter, bleeding, possibility of transfusion, infection, or fistula formation) reviewed in detail. Likelihood of success in alleviating the patient's condition was discussed. Routine postoperative instructions will be reviewed with the patient and her family in detail after surgery.  The patient concurred with the proposed plan, giving informed written consent for the surgery.   Preop testing ordered. Instructions reviewed, including NPO after midnight.     Holten Spano,  Pinki Rottman, MD [facility]   

## 2019-01-25 NOTE — H&P (View-Only) (Signed)
GYNECOLOGY PREOPERATIVE HISTORY AND PHYSICAL   Subjective:  Hannah Mason is a 43 y.o. [id number] here for surgical management of lost IUD threads, history of menorrhagia. No significant preoperative concerns.  Proposed surgery: Hysteroscopic IUD retrieval, Dilation and Curettage, Endometrial Ablation   Pertinent Gynecological History: Menses: flow is light to moderate, lasts 3-4 days. Cycles are irregular.  Contraception: Mirena IUD (used for management of menses) and tubal ligation  Last mammogram: abnormal: BIRADS 0; assymetry of left breast. Date: 01/18/2019 Last pap: normal Date: 03/09/2018   Past Medical History:  Diagnosis Date  . ADHD   . Allergy    seasonal  . Fever blister   . Headache(784.0)    frequent  . Scoliosis     Past Surgical History:  Procedure Laterality Date  . CESAREAN SECTION     1  . DILATION AND CURETTAGE OF UTERUS    . [facility]  2011   Miscarrigage in 2011  . TUBAL LIGATION      OB History  Gravida Para Term Preterm AB Living  3 2 2   1 2   SAB TAB Ectopic Multiple Live Births  1       2    # Outcome Date GA Lbr Len/2nd Weight Sex Delivery Anes PTL Lv  3 Term 2012   6 lb 14.4 oz (3.13 kg) F CS-LTranv   LIV  2 SAB 2010          1 Term 2007   6 lb 2.2 oz (2.785 kg) F CS-LTranv   LIV    Family History  Problem Relation Age of Onset  . Diabetes Father   . Hypertension Father   . Breast cancer Neg Hx   . Ovarian cancer Neg Hx   . Colon cancer Neg Hx   . Heart disease Neg Hx     Social History   Socioeconomic History  . Marital status: Married    Spouse name: Not on file  . Number of children: Not on file  . Years of education: Not on file  . Highest education level: Not on file  Occupational History  . Not on file  Social Needs  . Financial resource strain: Not on file  . Food insecurity    Worry: Not on file    Inability: Not on file  . Transportation needs    Medical: Not on file    Non-medical: Not on file  Tobacco Use    . Smoking status: Former Smoker    Quit date: 12/17/2005    Years since quitting: 13.1  . Smokeless tobacco: Never Used  Substance and Sexual Activity  . Alcohol use: Yes    Comment: occass  . Drug use: No  . Sexual activity: Yes    Birth control/protection: I.U.D.  Lifestyle  . Physical activity    Days per week: 6 days    Minutes per session: 60 min  . Stress: Not at all  Relationships  . Social 12/19/2005 on phone: Not on file    Gets together: Not on file    Attends religious service: Not on file    Active member of club or organization: Not on file    Attends meetings of clubs or organizations: Not on file    Relationship status: Not on file  . Intimate partner violence    Fear of current or ex partner: No    Emotionally abused: No    Physically abused: No  Forced sexual activity: No  Other Topics Concern  . Not on file  Social History Narrative   Lives with husband in [place], 4 children in home (14,12,5,54months). No pets. Work Water quality scientist.    Current Outpatient Medications on File Prior to Visit  Medication Sig Dispense Refill  . levonorgestrel (MIRENA) 20 MCG/24HR IUD 1 each by Intrauterine route once.    Marland Kitchen LORazepam (ATIVAN) 0.5 MG tablet Take 1 tablet (0.5 mg total) by mouth daily as needed for anxiety. 20 tablet 0  . valACYclovir (VALTREX) 1000 MG tablet Take 1 tablet (1,000 mg total) by mouth daily. 30 tablet 5  . Vitamin D, Ergocalciferol, (DRISDOL) 1.25 MG (50000 UT) CAPS capsule TK ONE C PO Q 7 DAYS     No current facility-administered medications on file prior to visit.    No Known Allergies   Review of Systems Constitutional: No recent fever/chills/sweats Respiratory: No recent cough/bronchitis Cardiovascular: No chest pain Gastrointestinal: No recent nausea/vomiting/diarrhea Genitourinary: No UTI symptoms Hematologic/lymphatic:No history of coagulopathy or recent blood thinner use    Objective:   Blood pressure 112/65, pulse 67, height 5'  7" (1.702 m), weight 140 lb 3.2 oz (63.6 kg). CONSTITUTIONAL: Well-developed, well-nourished female in no acute distress.  HENT:  Normocephalic, atraumatic, External right and left ear normal. Oropharynx is clear and moist EYES: Conjunctivae and EOM are normal. Pupils are equal, round, and reactive to light. No scleral icterus.  NECK: Normal range of motion, supple, no masses SKIN: Skin is warm and dry. No rash noted. Not diaphoretic. No erythema. No pallor. NEUROLOGIC: Alert and oriented to person, place, and time. Normal reflexes, muscle tone coordination. No cranial nerve deficit noted. PSYCHIATRIC: Normal mood and affect. Normal behavior. Normal judgment and thought content. CARDIOVASCULAR: Normal heart rate noted, regular rhythm RESPIRATORY: Effort and breath sounds normal, no problems with respiration noted ABDOMEN: Soft, nontender, nondistended. PELVIC: Deferred MUSCULOSKELETAL: Normal range of motion. No edema and no tenderness. 2+ distal pulses.    Labs: Lab Results  Component Value Date   WBC 7.5 03/09/2018   HGB 12.6 03/09/2018   HCT 37.6 03/09/2018   MCV 91 03/09/2018   PLT 281 03/09/2018    Imaging Studies: None  Assessment:    Preop examination  Intrauterine contraceptive device threads lost, subsequent encounter  History of menorrhagia  History of tubal ligation   Plan:    Counseling: Procedure, risks, reasons, benefits and complications (including injury to bowel, bladder, major blood vessel, ureter, bleeding, possibility of transfusion, infection, or fistula formation) reviewed in detail. Likelihood of success in alleviating the patient's condition was discussed. Routine postoperative instructions will be reviewed with the patient and her family in detail after surgery.  The patient concurred with the proposed plan, giving informed written consent for the surgery.   Preop testing ordered. Instructions reviewed, including NPO after midnight.     Rubie Maid, MD [facility]

## 2019-01-25 NOTE — Patient Instructions (Signed)
Hysteroscopy Hysteroscopy is a procedure that is used to examine the inside of a woman's womb (uterus). This may be done for various reasons, including:  To look for lumps (tumors) and other growths in the uterus.  To evaluate abnormal bleeding, fibroid tumors, polyps, scar tissue (adhesions), or cancer of the uterus.  To determine the cause of an inability to get pregnant (infertility) or repeated losses of pregnancies (miscarriages).  To find a lost IUD (intrauterine device).  To perform a procedure that permanently prevents pregnancy (sterilization). During this procedure, a thin, flexible tube with a small light and camera (hysteroscope) is used to examine the uterus. The camera sends images to a monitor in the room so that your health care provider can view the inside of your uterus. A hysteroscopy should be done right after a menstrual period to make sure that you are not pregnant. Tell a health care provider about:  Any allergies you have.  All medicines you are taking, including vitamins, herbs, eye drops, creams, and over-the-counter medicines.  Any problems you or family members have had with the use of anesthetic medicines.  Any blood disorders you have.  Any surgeries you have had.  Any medical conditions you have.  Whether you are pregnant or may be pregnant. What are the risks? Generally, this is a safe procedure. However, problems may occur, including:  Excessive bleeding.  Infection.  Damage to the uterus or other structures or organs.  Allergic reaction to medicines or fluids that are used in the procedure. What happens before the procedure? Staying hydrated Follow instructions from your health care provider about hydration, which may include:  Up to 2 hours before the procedure - you may continue to drink clear liquids, such as water, clear fruit juice, black coffee, and plain tea. Eating and drinking restrictions Follow instructions from your health care  provider about eating and drinking, which may include:  8 hours before the procedure - stop eating solid foods and drink clear liquids only  2 hours before the procedure - stop drinking clear liquids. General instructions  Ask your health care provider about: ? Changing or stopping your normal medicines. This is important if you take diabetes medicines or blood thinners. ? Taking medicines such as aspirin and ibuprofen. These medicines can thin your blood and cause bleeding. Do not take these medicines for 1 week before your procedure, or as told by your health care provider.  Do not use any products that contain nicotine or tobacco for 2 weeks before the procedure. This includes cigarettes and e-cigarettes. If you need help quitting, ask your health care provider.  Medicine may be placed in your cervix the day before the procedure. This medicine causes the cervix to have a larger opening (dilate). The larger opening makes it easier for the hysteroscope to be inserted into the uterus during the procedure.  Plan to have someone with you for the first 24-48 hours after the procedure, especially if you are given a medicine to make you fall asleep (general anesthetic).  Plan to have someone take you home from the hospital or clinic. What happens during the procedure?  To lower your risk of infection: ? Your health care team will wash or sanitize their hands. ? Your skin will be washed with soap. ? Hair may be removed from the surgical area.  An IV tube will be inserted into one of your veins.  You may be given one or more of the following: ? A medicine to help  you relax (sedative). ? A medicine that numbs the area around the cervix (local anesthetic). ? A medicine to make you fall asleep (general anesthetic).  A hysteroscope will be inserted through your vagina and into your uterus.  Air or fluid will be used to enlarge your uterus, enabling your health care provider to see your uterus  better. The amount of fluid used will be carefully checked throughout the procedure.  In some cases, tissue may be gently scraped from inside the uterus and sent to a lab for testing (biopsy). The procedure may vary among health care providers and hospitals. What happens after the procedure?  Your blood pressure, heart rate, breathing rate, and blood oxygen level will be monitored until the medicines you were given have worn off.  You may have some cramping. You may be given medicines for this.  You may have bleeding, which varies from light spotting to menstrual-like bleeding. This is normal.  If you had a biopsy done, it is your responsibility to get the results of your procedure. Ask your health care provider, or the department performing the procedure, when your results will be ready. Summary  Hysteroscopy is a procedure that is used to examine the inside of a woman's womb (uterus).  After the procedure, you may have bleeding, which varies from light spotting to menstrual-like bleeding. This is normal. You may also have cramping.  Plan to have someone take you home from the hospital or clinic. This information is not intended to replace advice given to you by your health care provider. Make sure you discuss any questions you have with your health care provider. Document Released: 05/12/2000 Document Revised: 01/16/2017 Document Reviewed: 03/04/2016 Elsevier Patient Education  2020 [employer]   Endometrial Ablation Endometrial ablation is a procedure that destroys the thin inner layer of the lining of the uterus (endometrium). This procedure may be done:  To stop heavy periods.  To stop bleeding that is causing anemia.  To control irregular bleeding.  To treat bleeding caused by small tumors (fibroids) in the endometrium. This procedure is often an alternative to major surgery, such as removal of the uterus and cervix (hysterectomy). As a result of this procedure:  You may  not be able to have children. However, if you are premenopausal (you have not gone through menopause): ? You may still have a small chance of getting pregnant. ? You will need to use a reliable method of birth control after the procedure to prevent pregnancy.  You may stop having a menstrual period, or you may have only a small amount of bleeding during your period. Menstruation may return several years after the procedure. Tell a health care provider about:  Any allergies you have.  All medicines you are taking, including vitamins, herbs, eye drops, creams, and over-the-counter medicines.  Any problems you or family members have had with the use of anesthetic medicines.  Any blood disorders you have.  Any surgeries you have had.  Any medical conditions you have. What are the risks? Generally, this is a safe procedure. However, problems may occur, including:  A hole (perforation) in the uterus or bowel.  Infection of the uterus, bladder, or vagina.  Bleeding.  Damage to other structures or organs.  An air bubble in the lung (air embolus).  Problems with pregnancy after the procedure.  Failure of the procedure.  Decreased ability to diagnose cancer in the endometrium. What happens before the procedure?  You will have tests of your endometrium to  make sure there are no pre-cancerous cells or cancer cells present.  You may have an ultrasound of the uterus.  You may be given medicines to thin the endometrium.  Ask your health care provider about: ? Changing or stopping your regular medicines. This is especially important if you take diabetes medicines or blood thinners. ? Taking medicines such as aspirin and ibuprofen. These medicines can thin your blood. Do not take these medicines before your procedure if your doctor tells you not to.  Plan to have someone take you home from the hospital or clinic. What happens during the procedure?   You will lie on an exam table  with your feet and legs supported as in a pelvic exam.  To lower your risk of infection: ? Your health care team will wash or sanitize their hands and put on germ-free (sterile) gloves. ? Your genital area will be washed with soap.  An IV tube will be inserted into one of your veins.  You will be given a medicine to help you relax (sedative).  A surgical instrument with a light and camera (resectoscope) will be inserted into your vagina and moved into your uterus. This allows your surgeon to see inside your uterus.  Endometrial tissue will be removed using one of the following methods: ? Radiofrequency. This method uses a radiofrequency-alternating electric current to remove the endometrium. ? Cryotherapy. This method uses extreme cold to freeze the endometrium. ? Heated-free liquid. This method uses a heated saltwater (saline) solution to remove the endometrium. ? Microwave. This method uses high-energy microwaves to heat up the endometrium and remove it. ? Thermal balloon. This method involves inserting a catheter with a balloon tip into the uterus. The balloon tip is filled with heated fluid to remove the endometrium. The procedure may vary among health care providers and hospitals. What happens after the procedure?  Your blood pressure, heart rate, breathing rate, and blood oxygen level will be monitored until the medicines you were given have worn off.  As tissue healing occurs, you may notice vaginal bleeding for 4-6 weeks after the procedure. You may also experience: ? Cramps. ? Thin, watery vaginal discharge that is light pink or brown in color. ? A need to urinate more frequently than usual. ? Nausea.  Do not drive for 24 hours if you were given a sedative.  Do not have sex or insert anything into your vagina until your health care provider approves. Summary  Endometrial ablation is done to treat the many causes of heavy menstrual bleeding.  The procedure may be done only  after medications have been tried to control the bleeding.  Plan to have someone take you home from the hospital or clinic. This information is not intended to replace advice given to you by your health care provider. Make sure you discuss any questions you have with your health care provider. Document Released: 12/14/2003 Document Revised: 07/21/2017 Document Reviewed: 02/21/2016 Elsevier Patient Education  2020 [employer].

## 2019-01-25 NOTE — Progress Notes (Signed)
Pt present for pre-op visit. Pt stated that she was doing well no problems.  

## 2019-01-31 ENCOUNTER — Ambulatory Visit
Admission: RE | Admit: 2019-01-31 | Discharge: 2019-01-31 | Disposition: A | Discharge status: Home / Self Care | Payer: BC Managed Care – PPO | Source: Ambulatory Visit | Attending: Internal Medicine | Admitting: Internal Medicine

## 2019-01-31 DIAGNOSIS — N6322 Unspecified lump in the left breast, upper inner quadrant: Secondary | ICD-10-CM | POA: Diagnosis not present

## 2019-01-31 DIAGNOSIS — N6489 Other specified disorders of breast: Secondary | ICD-10-CM | POA: Diagnosis not present

## 2019-01-31 DIAGNOSIS — R922 Inconclusive mammogram: Secondary | ICD-10-CM | POA: Diagnosis not present

## 2019-01-31 DIAGNOSIS — N6012 Diffuse cystic mastopathy of left breast: Secondary | ICD-10-CM | POA: Diagnosis not present

## 2019-01-31 DIAGNOSIS — R928 Other abnormal and inconclusive findings on diagnostic imaging of breast: Secondary | ICD-10-CM

## 2019-02-07 ENCOUNTER — Encounter
Admission: RE | Admit: 2019-02-07 | Discharge: 2019-02-07 | Disposition: A | Discharge status: Home / Self Care | Payer: BC Managed Care – PPO | Source: Ambulatory Visit | Attending: Obstetrics and Gynecology | Admitting: Obstetrics and Gynecology

## 2019-02-07 ENCOUNTER — Encounter: Payer: Self-pay | Admitting: Obstetrics and Gynecology

## 2019-02-07 ENCOUNTER — Other Ambulatory Visit: Payer: Self-pay

## 2019-02-07 NOTE — Patient Instructions (Addendum)
INSTRUCTIONS FOR SURGERY     Your surgery is scheduled for:   Monday, December 28TH     To find out your arrival time for the day of surgery,          please call [phone] between 1 pm and 3 pm on :  Thursday, December 24TH     When you arrive for surgery, report to the SECOND FLOOR OF THE MEDICAL MALL.       Do NOT stop on the first floor to register.    REMEMBER: Instructions that are not followed completely may result in serious medical risk,  up to and including death, or upon the discretion of your surgeon and anesthesiologist,            your surgery may need to be rescheduled.  __X__ 1. Do not eat food after midnight the night before your procedure.                    No gum, candy, lozenger, tic tacs, tums or hard candies.                  ABSOLUTELY NOTHING SOLID IN YOUR MOUTH AFTER MIDNIGHT                    You may drink unlimited clear liquids up to 2 hours before you are scheduled to arrive for surgery.                   Do not drink anything within those 2 hours unless you need to take medicine, then take the                   smallest amount you need.  Clear liquids include:  water, apple juice without pulp,                   any flavor Gatorade, Black coffee, black tea.  Sugar may be added but no dairy/ honey /lemon.                        Broth and jello is not considered a clear liquid.  __x__  2. On the morning of surgery, please brush your teeth with toothpaste and water. You may rinse with                  mouthwash if you wish but DO NOT SWALLOW TOOTHPASTE OR MOUTHWASH  __X___3. NO alcohol for 24 hours before or after surgery.  __x___ 4.  Do NOT smoke or use e-cigarettes for 24 HOURS PRIOR TO SURGERY.                      DO NOT use any chewable tobacco products for at least 6 hours prior to surgery.  __x___ 5. If you start any new medication after this appointment and prior to surgery, please           Bring it with you on the day of surgery.  ___x__ 6. Notify your doctor if there is any change in your medical condition, such as fever, infection, vomitting,  Diarrhea or any open sores.  __x___ 7.  USE ANTIBACTERIAL SOAP as instructed, the night before surgery and the day of surgery.                   Once you have washed with this soap, do NOT use any of the following: Powders, perfumes                    or lotions. Please do not wear make up, hairpins, clips or nail polish. You MAY wear deodorant.                   Men may shave their face and neck.  Women need to shave 48 hours prior to surgery.                   DO NOT wear ANY jewelry on the day of surgery. If there are rings that are too tight to                    remove easily, please address this prior to the surgery day. Piercings need to be removed.                                                                     NO METAL ON YOUR BODY.                    Do NOT bring any valuables.  If you came to Pre-Admit testing then you will not need license,                     insurance card or credit card.  If you will be staying overnight, please either leave your things in                     the car or have your family be responsible for these items.                     [facility] IS NOT RESPONSIBLE FOR BELONGINGS OR VALUABLES.  ___X__ 8. DO NOT wear contact lenses on surgery day.  You may not have dentures,                     Hearing aides, contacts or glasses in the operating room. These items can be                    Placed in the Recovery Room to receive immediately after surgery.  __x___ 9. IF YOU ARE SCHEDULED TO GO HOME ON THE SAME DAY, YOU MUST                   Have someone to drive you home and to stay with you  for the first 24 hours.                    Have an arrangement prior to arriving on surgery day.  ___x__ 10. Take the following medications on the morning of surgery with a sip of  water:  1.  ATIVAN                     2.                     3.                     4.                     5.                     6.  _____ 11.  Follow any instructions provided to you by your surgeon.                        Such as enema, clear liquid bowel prep  __X__  12. STOP  ASPIRIN AS OF: TODAY, December 21ST                       THIS INCLUDES BC POWDERS / GOODIES POWDER  __x___ 13. STOP Anti-inflammatories as of:  TODAY, December 21ST                      This includes IBUPROFEN / MOTRIN / ADVIL / ALEVE/ NAPROXYN                    YOU MAY TAKE TYLENOL ANY TIME PRIOR TO SURGERY.  _____ 14.  Stop supplements until after surgery.                     This includes:   N/A                 You may continue taking Vitamin B12 / Vitamin D3 but do not take on the morning of surgery.  ______17.  Continue to take the following medications but do not take on the morning of surgery:                          N/A  ______18. If staying overnight, please have appropriate shoes to wear to be able to walk around the unit.                   Wear clean and comfortable clothing to the hospital.   [place].

## 2019-02-08 ENCOUNTER — Other Ambulatory Visit: Payer: Self-pay | Admitting: Internal Medicine

## 2019-02-08 NOTE — Telephone Encounter (Signed)
Last filled 11/10/2018.Marland KitchenMarland KitchenMarland Kitchen please advise

## 2019-02-10 ENCOUNTER — Other Ambulatory Visit
Admission: RE | Admit: 2019-02-10 | Discharge: 2019-02-10 | Disposition: A | Discharge status: Home / Self Care | Payer: BC Managed Care – PPO | Source: Ambulatory Visit | Attending: Obstetrics and Gynecology | Admitting: Obstetrics and Gynecology

## 2019-02-10 DIAGNOSIS — Z01812 Encounter for preprocedural laboratory examination: Secondary | ICD-10-CM | POA: Insufficient documentation

## 2019-02-10 DIAGNOSIS — Z20828 Contact with and (suspected) exposure to other viral communicable diseases: Secondary | ICD-10-CM | POA: Insufficient documentation

## 2019-02-10 LAB — BASIC METABOLIC PANEL
Anion gap: 7 (ref 5–15)
BUN: 16 mg/dL (ref 6–20)
CO2: 27 mmol/L (ref 22–32)
Calcium: 9.4 mg/dL (ref 8.9–10.3)
Chloride: 105 mmol/L (ref 98–111)
Creatinine, Ser: 0.66 mg/dL (ref 0.44–1.00)
GFR calc Af Amer: >60 mL/min (ref >60)
GFR calc non Af Amer: >60 mL/min (ref >60)
Glucose, Bld: 89 mg/dL (ref 70–99)
Potassium: 3.8 mmol/L (ref 3.5–5.1)
Sodium: 139 mmol/L (ref 135–145)

## 2019-02-10 LAB — CBC
HCT: 42.8 % (ref 36.0–46.0)
Hemoglobin: 13.9 g/dL (ref 12.0–15.0)
MCH: 29.8 pg (ref 26.0–34.0)
MCHC: 32.5 g/dL (ref 30.0–36.0)
MCV: 91.6 fL (ref 80.0–100.0)
Platelets: 278 10*3/uL (ref 150–400)
RBC: 4.67 MIL/uL (ref 3.87–5.11)
RDW: 13.5 % (ref 11.5–15.5)
WBC: 7 10*3/uL (ref 4.0–10.5)
nRBC: 0 % (ref 0.0–0.2)

## 2019-02-10 LAB — SARS CORONAVIRUS 2 (TAT 6-24 HRS): SARS Coronavirus 2: NEGATIVE

## 2019-02-14 ENCOUNTER — Ambulatory Visit
Admission: RE | Admit: 2019-02-14 | Discharge: 2019-02-14 | Disposition: A | Discharge status: Home / Self Care | Payer: BC Managed Care – PPO | Attending: Obstetrics and Gynecology | Admitting: Obstetrics and Gynecology

## 2019-02-14 ENCOUNTER — Other Ambulatory Visit: Payer: Self-pay

## 2019-02-14 ENCOUNTER — Ambulatory Visit: Payer: BC Managed Care – PPO | Admitting: Anesthesiology

## 2019-02-14 ENCOUNTER — Encounter
Admission: RE | Disposition: A | Discharge status: Home / Self Care | Payer: Self-pay | Source: Home / Self Care | Attending: Obstetrics and Gynecology

## 2019-02-14 ENCOUNTER — Encounter: Payer: Self-pay | Admitting: Obstetrics and Gynecology

## 2019-02-14 DIAGNOSIS — T8332XS Displacement of intrauterine contraceptive device, sequela: Secondary | ICD-10-CM

## 2019-02-14 DIAGNOSIS — N920 Excessive and frequent menstruation with regular cycle: Secondary | ICD-10-CM

## 2019-02-14 DIAGNOSIS — Z87891 Personal history of nicotine dependence: Secondary | ICD-10-CM | POA: Insufficient documentation

## 2019-02-14 DIAGNOSIS — Z8742 Personal history of other diseases of the female genital tract: Secondary | ICD-10-CM | POA: Diagnosis not present

## 2019-02-14 DIAGNOSIS — Z30432 Encounter for removal of intrauterine contraceptive device: Secondary | ICD-10-CM | POA: Diagnosis not present

## 2019-02-14 DIAGNOSIS — N943 Premenstrual tension syndrome: Secondary | ICD-10-CM | POA: Diagnosis not present

## 2019-02-14 DIAGNOSIS — T8332XA Displacement of intrauterine contraceptive device, initial encounter: Secondary | ICD-10-CM | POA: Diagnosis not present

## 2019-02-14 DIAGNOSIS — Y838 Other surgical procedures as the cause of abnormal reaction of the patient, or of later complication, without mention of misadventure at the time of the procedure: Secondary | ICD-10-CM | POA: Insufficient documentation

## 2019-02-14 DIAGNOSIS — Z9851 Tubal ligation status: Secondary | ICD-10-CM | POA: Diagnosis not present

## 2019-02-14 DIAGNOSIS — N939 Abnormal uterine and vaginal bleeding, unspecified: Secondary | ICD-10-CM

## 2019-02-14 HISTORY — DX: Anxiety disorder, unspecified: F41.9

## 2019-02-14 HISTORY — DX: Anemia, unspecified: D64.9

## 2019-02-14 HISTORY — PX: DILATATION AND CURETTAGE/HYSTEROSCOPY WITH MINERVA: SHX6851

## 2019-02-14 HISTORY — PX: IUD REMOVAL: SHX5392

## 2019-02-14 LAB — POCT PREGNANCY, URINE: Preg Test, Ur: NEGATIVE

## 2019-02-14 SURGERY — REMOVAL, INTRAUTERINE DEVICE
Anesthesia: General

## 2019-02-14 MED ORDER — LIDOCAINE HCL (CARDIAC) PF 100 MG/5ML IV SOSY
PREFILLED_SYRINGE | INTRAVENOUS | Status: DC | PRN
  Administered 2019-02-14: 80 mg via INTRAVENOUS

## 2019-02-14 MED ORDER — MIDAZOLAM HCL 2 MG/2ML IJ SOLN
INTRAMUSCULAR | Status: DC | PRN
  Administered 2019-02-14: 2 mg via INTRAVENOUS

## 2019-02-14 MED ORDER — LACTATED RINGERS IV SOLN: INTRAVENOUS | Status: DC

## 2019-02-14 MED ORDER — KETOROLAC TROMETHAMINE 15 MG/ML IJ SOLN
INTRAMUSCULAR | Status: AC
  Filled 2019-02-14: qty 1

## 2019-02-14 MED ORDER — ONDANSETRON HCL 4 MG/2ML IJ SOLN
INTRAMUSCULAR | Status: DC | PRN
  Administered 2019-02-14: 4 mg via INTRAVENOUS

## 2019-02-14 MED ORDER — EPHEDRINE SULFATE 50 MG/ML IJ SOLN
INTRAMUSCULAR | Status: AC
  Filled 2019-02-14: qty 1

## 2019-02-14 MED ORDER — ACETAMINOPHEN-CODEINE #3 300-30 MG PO TABS: 1.0000 | ORAL_TABLET | ORAL | 0 refills | Status: DC | PRN

## 2019-02-14 MED ORDER — FENTANYL CITRATE (PF) 100 MCG/2ML IJ SOLN
INTRAMUSCULAR | Status: AC
  Filled 2019-02-14: qty 2

## 2019-02-14 MED ORDER — DEXAMETHASONE SODIUM PHOSPHATE 10 MG/ML IJ SOLN
INTRAMUSCULAR | Status: DC | PRN
  Administered 2019-02-14: 10 mg via INTRAVENOUS

## 2019-02-14 MED ORDER — PROPOFOL 10 MG/ML IV BOLUS
INTRAVENOUS | Status: DC | PRN
  Administered 2019-02-14: 120 mg via INTRAVENOUS

## 2019-02-14 MED ORDER — MIDAZOLAM HCL 2 MG/2ML IJ SOLN
INTRAMUSCULAR | Status: AC
  Filled 2019-02-14: qty 2

## 2019-02-14 MED ORDER — FAMOTIDINE 20 MG PO TABS
ORAL_TABLET | ORAL | Status: AC
  Administered 2019-02-14: 20 mg via ORAL
  Filled 2019-02-14: qty 1

## 2019-02-14 MED ORDER — SEVOFLURANE IN SOLN
RESPIRATORY_TRACT | Status: AC
  Filled 2019-02-14: qty 250

## 2019-02-14 MED ORDER — PHENYLEPHRINE HCL (PRESSORS) 10 MG/ML IV SOLN
INTRAVENOUS | Status: AC
  Filled 2019-02-14: qty 1

## 2019-02-14 MED ORDER — IBUPROFEN 800 MG PO TABS
800.0000 mg | ORAL_TABLET | Freq: Three times a day (TID) | ORAL | Status: DC | PRN
  Administered 2019-02-14: 800 mg via ORAL
  Filled 2019-02-14 (×2): qty 1

## 2019-02-14 MED ORDER — ACETAMINOPHEN 500 MG PO TABS: 1000.0000 mg | ORAL_TABLET | ORAL | Status: AC

## 2019-02-14 MED ORDER — LACTATED RINGERS IV SOLN: INTRAVENOUS | Status: DC | PRN

## 2019-02-14 MED ORDER — IBUPROFEN 800 MG PO TABS: 800.0000 mg | ORAL_TABLET | Freq: Three times a day (TID) | ORAL | 1 refills | Status: DC | PRN

## 2019-02-14 MED ORDER — KETOROLAC TROMETHAMINE 30 MG/ML IJ SOLN
15.0000 mg | INTRAMUSCULAR | Status: AC
  Administered 2019-02-14: 15 mg via INTRAVENOUS

## 2019-02-14 MED ORDER — FAMOTIDINE 20 MG PO TABS: 20.0000 mg | ORAL_TABLET | Freq: Once | ORAL | Status: AC

## 2019-02-14 MED ORDER — FENTANYL CITRATE (PF) 100 MCG/2ML IJ SOLN
INTRAMUSCULAR | Status: DC | PRN
  Administered 2019-02-14 (×2): 25 ug via INTRAVENOUS
  Administered 2019-02-14: 50 ug via INTRAVENOUS

## 2019-02-14 MED ORDER — ACETAMINOPHEN 500 MG PO TABS
ORAL_TABLET | ORAL | Status: AC
  Administered 2019-02-14: 1000 mg via ORAL
  Filled 2019-02-14: qty 2

## 2019-02-14 MED ORDER — PROPOFOL 500 MG/50ML IV EMUL
INTRAVENOUS | Status: AC
  Filled 2019-02-14: qty 50

## 2019-02-14 MED ORDER — ONDANSETRON HCL 4 MG/2ML IJ SOLN: 4.0000 mg | Freq: Once | INTRAMUSCULAR | Status: AC | PRN

## 2019-02-14 MED ORDER — IBUPROFEN 800 MG PO TABS
ORAL_TABLET | ORAL | Status: AC
  Filled 2019-02-14: qty 1

## 2019-02-14 MED ORDER — FENTANYL CITRATE (PF) 100 MCG/2ML IJ SOLN
25.0000 ug | INTRAMUSCULAR | Status: DC | PRN
  Administered 2019-02-14: 25 ug via INTRAVENOUS

## 2019-02-14 MED ORDER — ONDANSETRON HCL 4 MG/2ML IJ SOLN
INTRAMUSCULAR | Status: AC
  Administered 2019-02-14: 4 mg via INTRAVENOUS
  Filled 2019-02-14: qty 2

## 2019-02-14 SURGICAL SUPPLY — 20 items
CATH ROBINSON RED A/P 16FR (CATHETERS) ×3 IMPLANT
COVER WAND RF STERILE (DRAPES) ×3 IMPLANT
CUP MEDICINE 2OZ PLAST GRAD ST (MISCELLANEOUS) ×3 IMPLANT
DRAPE UNDER BUTTOCK W/FLU (DRAPES) ×3 IMPLANT
DRSG TELFA 3X8 NADH (GAUZE/BANDAGES/DRESSINGS) ×3 IMPLANT
GAUZE 4X4 16PLY RFD (DISPOSABLE) ×3 IMPLANT
GLOVE BIO SURGEON STRL SZ 6.5 (GLOVE) ×5 IMPLANT
GLOVE BIO SURGEONS STRL SZ 6.5 (GLOVE) ×4
GLOVE INDICATOR 7.0 STRL GRN (GLOVE) ×3 IMPLANT
GOWN STRL REUS W/ TWL LRG LVL3 (GOWN DISPOSABLE) ×2 IMPLANT
GOWN STRL REUS W/TWL LRG LVL3 (GOWN DISPOSABLE) ×4
HANDPIECE ABLA MINERVA ENDO (MISCELLANEOUS) ×2 IMPLANT
KIT TURNOVER CYSTO (KITS) ×3 IMPLANT
PACK DNC HYST (MISCELLANEOUS) ×3 IMPLANT
PAD DRESSING TELFA 3X8 NADH (GAUZE/BANDAGES/DRESSINGS) ×1 IMPLANT
PAD OB MATERNITY 4.3X12.25 (PERSONAL CARE ITEMS) ×3 IMPLANT
PAD PREP 24X41 OB/GYN DISP (PERSONAL CARE ITEMS) ×3 IMPLANT
SOL PREP PVP 2OZ (MISCELLANEOUS) ×3
SOLUTION PREP PVP 2OZ (MISCELLANEOUS) ×1 IMPLANT
TOWEL OR 17X26 4PK STRL BLUE (TOWEL DISPOSABLE) ×3 IMPLANT

## 2019-02-14 NOTE — Anesthesia Post-op Follow-up Note (Signed)
Anesthesia QCDR form completed.        

## 2019-02-14 NOTE — Anesthesia Postprocedure Evaluation (Signed)
Anesthesia Post Note  Patient: FAYOLA MECKES  Procedure(s) Performed: INTRAUTERINE DEVICE (IUD) REMOVAL (N/A ) DILATATION AND CURETTAGE/HYSTEROSCOPY WITH MINERVA (N/A )  Patient location during evaluation: PACU Anesthesia Type: General Level of consciousness: awake and alert and oriented Pain management: pain level controlled Vital Signs Assessment: post-procedure vital signs reviewed and stable Respiratory status: spontaneous breathing Cardiovascular status: blood pressure returned to baseline Anesthetic complications: no     Last Vitals:  Vitals:   02/14/19 1152 02/14/19 1200  BP: (!) 87/62 99/61  Pulse: (!) 51 (!) 52  Resp: 16 18  Temp:    SpO2: 100% 99%    Last Pain:  Vitals:   02/14/19 1200  TempSrc:   PainSc: 4                  [place]

## 2019-02-14 NOTE — OR Nursing (Signed)
Discussed BP with Dr. Kayleen Memos, will give 500cc LR as instructed.

## 2019-02-14 NOTE — Transfer of Care (Signed)
Immediate Anesthesia Transfer of Care Note  Patient: Hannah Mason  Procedure(s) Performed: INTRAUTERINE DEVICE (IUD) REMOVAL (N/A ) DILATATION AND CURETTAGE/HYSTEROSCOPY WITH MINERVA (N/A )  Patient Location: PACU  Anesthesia Type:General  Level of Consciousness: sedated  Airway & Oxygen Therapy: Patient Spontanous Breathing and Patient connected to face mask oxygen  Post-op Assessment: Report given to RN and Post -op Vital signs reviewed and stable  Post vital signs: Reviewed and stable  Last Vitals:  Vitals Value Taken Time  BP 109/73 02/14/19 0851  Temp 36.3 C 02/14/19 0851  Pulse 82 02/14/19 0854  Resp 0 02/14/19 0854  SpO2 95 % 02/14/19 0854  Vitals shown include unvalidated device data.  Last Pain:  Vitals:   02/14/19 0851  TempSrc:   PainSc: Asleep         Complications: No apparent anesthesia complications

## 2019-02-14 NOTE — Op Note (Addendum)
Procedure(s): INTRAUTERINE DEVICE (IUD) REMOVAL DILATATION AND CURETTAGE/HYSTEROSCOPY WITH MINERVA Procedure Note  Hannah Mason female 43 y.o. 02/14/2019  Indications: The patient is a 43 y.o. [id number] female with lost IUD threads, history of heavy menses.   Pre-operative Diagnosis: Lost IUD threads, history of heavy  menses  Post-operative Diagnosis: Same  Surgeon: Rubie Maid, MD  Assistants:  None  Anesthesia: General endotracheal anesthesia  Findings: Uterus sounded to 9.5 cm.  Cervical length 3.5 cm.  IUD noted at level of internal cervical os. Weakly proliferative endometrium.  Tubal ostia were visualized bilaterally.  No intrauterine masses.  Adequate charring of endometrial tissue post ablation.  No perforations noted.   Procedure Details: The patient was seen in the Holding Room. The risks, benefits, complications, treatment options, and expected outcomes were discussed with the patient.  The patient concurred with the proposed plan, giving informed consent.  The site of surgery properly noted/marked. The patient was taken to the Operating Room, identified as Hannah Mason and the procedure verified as Procedure(s) (LRB): INTRAUTERINE DEVICE (IUD) REMOVAL (N/A); DILATATION AND CURETTAGE/HYSTEROSCOPY WITH MINERVA (N/A).   The patient was placed under general anesthesia without difficulty.  She was then prepped and draped in the normal sterile fashion, and placed in the dorsal lithotomy position.  A time out was performed.  An exam under anesthesia was performed with the findings noted above.  Straight catheterization was performed. A sterile speculum was inserted into vagina. A single-tooth tenaculum was used to grasp the anterior lip of the cervix. Cervical dilation was performed. A 5 mm hysteroscope was introduced into the uterus under direct visualization. The base of the IUD was noted at the level of the endocervical os.  A hysteroscopic grasper was used to grab the IUD  and the device was removed in its entirety without difficulty. The hysteroscope was then reintroduced into the uterus. The cavity was allowed to fill, and then the entire cavity was explored with the findings described above. The hysteroscope was removed, and a sharp curette was then passed into the uterus and endometrial sampling was collected for pathology.   Next the Minerva instrument was primed per instructions. The instrument was then placed into the endometrial canal and activated.  The Minerva instrument was then removed from the uterine cavity.  The hysteroscope was then re-introduced for final survey, with adequate charring of the endometrium noted.  The hysteroscope was removed from the patient's uterine cavity. The tenaculum was removed and excellent hemostasis was noted. The speculum was removed from the vagina.   All instrument and sponge counts were correct at the end of the procedure x 2.  The patient tolerated the procedure well.  She was awakened and taken to the PACU in stable condition.    Estimated Blood Loss:  minimal      Drains: straight catheterization with 400 ml at start of procedure.          Total IV Fluids:  700 ml  Specimens:  Endometrial curettings; IUD         Implants: None         Complications:  None; patient tolerated the procedure well.         Disposition: PACU - hemodynamically stable.         Condition: stable   Rubie Maid, MD [facility]

## 2019-02-14 NOTE — Anesthesia Procedure Notes (Signed)
Procedure Name: LMA Insertion Date/Time: 02/14/2019 7:40 AM Performed by: Justus Memory, CRNA Pre-anesthesia Checklist: Patient identified, Patient being monitored, Timeout performed, Emergency Drugs available and Suction available Patient Re-evaluated:Patient Re-evaluated prior to induction Oxygen Delivery Method: Circle system utilized Preoxygenation: Pre-oxygenation with 100% oxygen Induction Type: IV induction Ventilation: Mask ventilation without difficulty LMA: LMA inserted LMA Size: 3.5 Tube type: Oral Number of attempts: 1 Placement Confirmation: positive ETCO2 and breath sounds checked- equal and bilateral Tube secured with: Tape Dental Injury: Teeth and Oropharynx as per pre-operative assessment

## 2019-02-14 NOTE — OR Nursing (Signed)
BP 99/61; ok to d/c to home per Dr. Kayleen Memos.

## 2019-02-14 NOTE — Anesthesia Preprocedure Evaluation (Signed)
Anesthesia Evaluation  Patient identified by MRN, date of birth, ID band Patient awake    Reviewed: Allergy & Precautions, NPO status , Patient's Chart, lab work & pertinent test results  Airway Mallampati: I  TM Distance: >3 FB     Dental  (+) Teeth Intact   Pulmonary former smoker,    Pulmonary exam normal        Cardiovascular negative cardio ROS Normal cardiovascular exam     Neuro/Psych  Headaches, PSYCHIATRIC DISORDERS Anxiety    GI/Hepatic negative GI ROS, Neg liver ROS,   Endo/Other  negative endocrine ROS  Renal/GU negative Renal ROS  Female GU complaint     Musculoskeletal scoliosis   Abdominal Normal abdominal exam  (+)   Peds negative pediatric ROS (+)  Hematology  (+) anemia ,   Anesthesia Other Findings Past Medical History: No date: ADHD No date: Allergy     Comment:  seasonal No date: Anemia     Comment:  vitamin d deficiency No date: Anxiety No date: Fever blister No date: Headache(784.0)     Comment:  frequent No date: Scoliosis  Reproductive/Obstetrics                             Anesthesia Physical Anesthesia Plan  ASA: II  Anesthesia Plan: General   Post-op Pain Management:    Induction: Intravenous  PONV Risk Score and Plan:   Airway Management Planned: LMA  Additional Equipment:   Intra-op Plan:   Post-operative Plan: Extubation in OR  Informed Consent: I have reviewed the patients History and Physical, chart, labs and discussed the procedure including the risks, benefits and alternatives for the proposed anesthesia with the patient or authorized representative who has indicated his/her understanding and acceptance.     Dental advisory given  Plan Discussed with: CRNA and Surgeon  Anesthesia Plan Comments:         Anesthesia Quick Evaluation

## 2019-02-14 NOTE — Discharge Instructions (Signed)
AMBULATORY SURGERY  DISCHARGE INSTRUCTIONS   1) The drugs that you were given will stay in your system until tomorrow so for the next 24 hours you should not:  A) Drive an automobile B) Make any legal decisions C) Drink any alcoholic beverage   2) You may resume regular meals tomorrow.  Today it is better to start with liquids and gradually work up to solid foods.  You may eat anything you prefer, but it is better to start with liquids, then soup and crackers, and gradually work up to solid foods.   3) Please notify your doctor immediately if you have any unusual bleeding, trouble breathing, redness and pain at the surgery site, drainage, fever, or pain not relieved by medication.    4) Additional Instructions:        Please contact your physician with any problems or Same Day Surgery at [phone], Monday through Friday 6 am to 4 pm, or [place] at [facility] number at [phone].   Endometrial Ablation, Care After This sheet gives you information about how to care for yourself after your procedure. Your health care provider may also give you more specific instructions. If you have problems or questions, contact your health care provider. What can I expect after the procedure? After the procedure, it is common to have:  A need to urinate more frequently than usual for the first 24 hours.  Cramps similar to menstrual cramps. These may last for 1-2 days.  Thin, watery vaginal discharge that is light pink or brown in color. This may last a few weeks. Discharge will be heavy for the first few days after your procedure. You may need to wear a sanitary pad.  Nausea.  Vaginal bleeding for 4-6 weeks after the procedure, as tissue healing occurs. Follow these instructions at home: Activity  Do not drive for 24 hours if you were given amedicine to help you relax (sedative) during your procedure.  Do not have sex or put anything into your vagina until your health  care provider approves.  Do not lift anything that is heavier than 10 lb (4.5 kg), or the limit that you are told, until your health care provider says that it is safe.  Return to your normal activities as told by your health care provider. Ask your health care provider what activities are safe for you. General instructions   Take over-the-counter and prescription medicines only as told by your health care provider.  Do not take baths, swim, or use a hot tub until your health care provider approves. You will be able to take showers.  Check your vaginal area every day for signs of infection. Check for: ? Redness, swelling, or pain. ? More discharge or blood, instead of less. ? Bad-smelling discharge.  Keep all follow-up visits as told by your health care provider. This is important.  Drink enough fluid to keep your urine pale yellow. Contact a health care provider if you have:  Vaginal redness, swelling, or pain.  Vaginal discharge or bleeding that gets worse instead of getting better.  Bad-smelling vaginal discharge.  A fever or chills.  Trouble urinating. Get help right away if you have:  Heavy vaginal bleeding.  Severe cramps. Summary  After endometrial ablation, it is normal to have thin, watery vaginal discharge that is light pink or brown in color. This may last a few weeks and may be heavier right after the procedure.  Vaginal bleeding is also normal after the procedure and should get  better with time.  Check your vaginal area every day for signs of infection, such as bad-smelling discharge.  Keep all follow-up visits as told by your health care provider. This is important. This information is not intended to replace advice given to you by your health care provider. Make sure you discuss any questions you have with your health care provider. Document Released: 12/16/2016 Document Revised: 05/27/2018 Document Reviewed: 12/16/2016 Elsevier Patient Education  2020  [employer].

## 2019-02-14 NOTE — Interval H&P Note (Signed)
History and Physical Interval Note:  02/14/2019 7:12 AM  Hannah Mason  has presented today for surgery, with the diagnosis of unscuccessful attempt to remove IUD, heavy menstrual cycles.  The various methods of treatment have been discussed with the patient and family. After consideration of risks, benefits and other options for treatment, the patient has consented to  Procedure(s): INTRAUTERINE DEVICE (IUD) REMOVAL (N/A) DILATATION AND CURETTAGE/HYSTEROSCOPY WITH MINERVA (N/A) as a surgical intervention.  The patient's history has been reviewed, patient examined, no change in status, stable for surgery.  I have reviewed the patient's chart and labs.  Questions were answered to the patient's satisfaction.     Rubie Maid, MD [facility]

## 2019-02-15 LAB — SURGICAL PATHOLOGY

## 2019-02-23 ENCOUNTER — Other Ambulatory Visit: Payer: Self-pay

## 2019-02-23 ENCOUNTER — Encounter: Payer: Self-pay | Admitting: Obstetrics and Gynecology

## 2019-02-23 ENCOUNTER — Encounter: Payer: BC Managed Care – PPO | Admitting: Obstetrics and Gynecology

## 2019-02-23 ENCOUNTER — Ambulatory Visit (INDEPENDENT_AMBULATORY_CARE_PROVIDER_SITE_OTHER): Payer: BC Managed Care – PPO | Admitting: Obstetrics and Gynecology

## 2019-02-23 VITALS — BP 100/62 | HR 65 | Ht 67.0 in | Wt 136.7 lb

## 2019-02-23 DIAGNOSIS — Z4889 Encounter for other specified surgical aftercare: Secondary | ICD-10-CM

## 2019-02-23 DIAGNOSIS — Z9889 Other specified postprocedural states: Secondary | ICD-10-CM

## 2019-02-23 NOTE — Progress Notes (Signed)
Pt present for 1 week post op. Pt stated that she is doing well no problems.

## 2019-02-23 NOTE — Progress Notes (Signed)
    OBSTETRICS/GYNECOLOGY POST-OPERATIVE CLINIC VISIT  Subjective:     Hannah Mason is a 44 y.o. female who presents to the clinic 1 weeks status post Hysteroscopy D&C with IUD retrieval and endometrial ablation (Minerva) for heavy menses, and lost IUD threads. Eating a regular diet without difficulty. Bowel movements are normal. The patient is not having any pain.  Still noting some watery discharge, however is lessening.   The following portions of the patient's history were reviewed and updated as appropriate: allergies, current medications, past family history, past medical history, past social history, past surgical history and problem list.  Review of Systems Pertinent items noted in HPI and remainder of comprehensive ROS otherwise negative.    Objective:    BP 100/62   Pulse 65   Ht 5\' 7"  (1.702 m)   Wt 136 lb 11.2 oz (62 kg)   BMI 21.41 kg/m  General:  alert and no distress  Abdomen: soft, bowel sounds active, non-tender  Pelvis:   deferred.     Pathology:  A. ENDOMETRIUM; CURETTAGE:  - SCANT SUPERFICIAL STRIPS OF INACTIVE ENDOMETRIAL TISSUE WITH STROMAL  DECIDUALIZATION, COMPATIBLE WITH PROGESTIN EFFECT.  - NEGATIVE FOR ATYPICAL HYPERPLASIA/EIN AND MALIGNANCY.   Assessment:    Doing well postoperatively.  S/p hysteroscopic endometrial ablation (after IUD removal)  Plan:   1. Continue any current medications if needed. 2. Wound care discussed. 3. Operative findings again reviewed. Pathology report discussed. 4. Activity restrictions: none, pelvic rest until the end of the week.  5. Anticipated return to work: now if applicable. 6. Follow up: as needed.  Return to clinic for any scheduled appointments or for any gynecologic concerns as needed. Is due for next annual exam in 10 months.      , MD [facility]

## 2019-04-04 ENCOUNTER — Telehealth: Payer: Self-pay | Admitting: Obstetrics and Gynecology

## 2019-04-04 ENCOUNTER — Other Ambulatory Visit: Payer: Self-pay

## 2019-04-04 MED ORDER — VALACYCLOVIR HCL 1 G PO TABS: 1000.0000 mg | ORAL_TABLET | Freq: Every day | ORAL | 6 refills | Status: DC | PRN

## 2019-04-04 NOTE — Telephone Encounter (Signed)
Patient called saying she needed her prescription for fever blisters refilled. Could you please advise patient.  Thank you, TC

## 2019-04-04 NOTE — Telephone Encounter (Signed)
Pt called and aware that her medication has been refilled.  

## 2019-05-05 DIAGNOSIS — H93299 Other abnormal auditory perceptions, unspecified ear: Secondary | ICD-10-CM | POA: Diagnosis not present

## 2019-05-05 DIAGNOSIS — H6122 Impacted cerumen, left ear: Secondary | ICD-10-CM | POA: Diagnosis not present

## 2019-05-05 DIAGNOSIS — H60332 Swimmer's ear, left ear: Secondary | ICD-10-CM | POA: Diagnosis not present

## 2019-05-11 DIAGNOSIS — H60332 Swimmer's ear, left ear: Secondary | ICD-10-CM | POA: Diagnosis not present

## 2019-05-18 DIAGNOSIS — H60332 Swimmer's ear, left ear: Secondary | ICD-10-CM | POA: Diagnosis not present

## 2019-05-30 ENCOUNTER — Telehealth (INDEPENDENT_AMBULATORY_CARE_PROVIDER_SITE_OTHER): Payer: BC Managed Care – PPO | Admitting: Internal Medicine

## 2019-05-30 ENCOUNTER — Encounter: Payer: Self-pay | Admitting: Internal Medicine

## 2019-05-30 ENCOUNTER — Other Ambulatory Visit: Payer: Self-pay

## 2019-05-30 DIAGNOSIS — F411 Generalized anxiety disorder: Secondary | ICD-10-CM

## 2019-05-30 MED ORDER — CLONAZEPAM 1 MG PO TABS: 1.0000 mg | ORAL_TABLET | Freq: Every day | ORAL | 0 refills | Status: DC | PRN

## 2019-05-30 NOTE — Patient Instructions (Signed)

## 2019-05-30 NOTE — Assessment & Plan Note (Signed)
Deteriorated She wants to hold off on SSRI therapy at this time D/C Lorazepam RX for Clonazepam 1 mg daily prn- discussed addictive properties  Update me in 1 month and let me know how you are doing

## 2019-05-30 NOTE — Progress Notes (Signed)
Virtual Visit via Video Note  I connected with Hannah Mason on 05/30/19 at  3:45 PM EDT by a video enabled telemedicine application and verified that I am speaking with the correct person using two identifiers.  Location: Patient: In her car Provider: Office   I discussed the limitations of evaluation and management by telemedicine and the availability of in person appointments. The patient expressed understanding and agreed to proceed.  History of Present Illness:  Pt wanting to follow up on anxiety. She reports feeling more anxious and on edge. 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 She reports she is currently taking Lorazepam 5-6 times per month. She does not feel like it is as effective as it used to be. She has taken Citalopram in the past but is not longer taking it. She does not feel like she needs something on a daily basis, just as needed. She denies depression, SI/HI.    Past Medical History:  Diagnosis Date  . ADHD   . Allergy    seasonal  . Anemia    vitamin d deficiency  . Anxiety   . Fever blister   . Headache(784.0)    frequent  . Scoliosis     Current Outpatient Medications  Medication Sig Dispense Refill  . acetaminophen-codeine (TYLENOL #3) 300-30 MG tablet Take 1-2 tablets by mouth every 4 (four) hours as needed for moderate pain or severe pain. 10 tablet 0  . ibuprofen (ADVIL) 800 MG tablet Take 1 tablet (800 mg total) by mouth every 8 (eight) hours as needed. 40 tablet 1  . LORazepam (ATIVAN) 0.5 MG tablet Take 1 tablet (0.5 mg total) by mouth daily as needed. SCHEDULE PHYSICAL EXAM 20 tablet 0  . valACYclovir (VALTREX) 1000 MG tablet Take 1 tablet (1,000 mg total) by mouth daily as needed (outbreak). 30 tablet 6   No current facility-administered medications for this visit.    No Known Allergies  Family History  Problem Relation Age of Onset  . Diabetes Father   . Hypertension Father   . Breast  cancer Neg Hx   . Ovarian cancer Neg Hx   . Colon cancer Neg Hx   . Heart disease Neg Hx     Social History   Socioeconomic History  . Marital status: Married    Spouse name: Leroy Sea  . Number of children: 4  . Years of education: Not on file  . Highest education level: Not on file  Occupational History  . Occupation: Control and instrumentation engineer  Tobacco Use  . Smoking status: Former Smoker    Types: Cigarettes    Quit date: 12/17/2005    Years since quitting: 13.4  . Smokeless tobacco: Never Used  Substance and Sexual Activity  . Alcohol use: Yes    Comment: occass  . Drug use: No  . Sexual activity: Not Currently    Comment: iud to be removed 02/14/19  Other Topics Concern  . Not on file  Social History Narrative   Lives with husband in [place], 4 children in home (14,12,5,3months). No pets. Work Water quality scientist.   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   Social Determinants of Health   Financial Resource Strain:   . Difficulty of Paying Living Expenses:   Food Insecurity:   . Worried About Charity fundraiser in the Last Year:   . Arboriculturist in the Last Year:   Transportation Needs:   . Film/video editor (Medical):   Marland Kitchen  Lack of Transportation (Non-Medical):   Physical Activity:   . Days of Exercise per Week:   . Minutes of Exercise per Session:   Stress:   . Feeling of Stress :   Social Connections:   . Frequency of Communication with Friends and Family:   . Frequency of Social Gatherings with Friends and Family:   . Attends Religious Services:   . Active Member of Clubs or Organizations:   . Attends Banker Meetings:   Marland Kitchen Marital Status:   Intimate Partner Violence:   . Fear of Current or Ex-Partner:   . Emotionally Abused:   Marland Kitchen Physically Abused:   . Sexually Abused:      Constitutional: Denies fever, malaise, fatigue, headache or abrupt weight changes. Marland Kitchen Respiratory: Denies difficulty breathing, shortness of breath, cough or sputum production.    Cardiovascular: Denies chest pain, chest tightness, palpitations or swelling in the hands or feet.  Neurological: Denies dizziness, difficulty with memory, difficulty with speech or problems with balance and coordination.  Psych: Pt reports anxiety. Denies depression, SI/HI.  No other specific complaints in a complete review of systems (except as listed in HPI above).  Observations/Objective: There were no vitals taken for this visit.  Wt Readings from Last 3 Encounters:  02/23/19 136 lb 11.2 oz (62 kg)  02/14/19 135 lb (61.2 kg)  02/07/19 135 lb (61.2 kg)    General: Appears her stated age, well developed, well nourished in NAD. Pulmonary/Chest: Normal effort.  Neurological: Alert and oriented.  Psychiatric: Mood and affect normal. Behavior is normal. Judgment and thought content normal.     BMET    Component Value Date/Time   NA 139 02/10/2019 0944   NA 139 03/09/2018 1430   NA 139 11/15/2011 0748   K 3.8 02/10/2019 0944   K 3.7 11/15/2011 0748   CL 105 02/10/2019 0944   CL 107 11/15/2011 0748   CO2 27 02/10/2019 0944   CO2 24 11/15/2011 0748   GLUCOSE 89 02/10/2019 0944   GLUCOSE 103 (H) 11/15/2011 0748   BUN 16 02/10/2019 0944   BUN 19 03/09/2018 1430   BUN 10 11/15/2011 0748   CREATININE 0.66 02/10/2019 0944   CREATININE 0.55 (L) 11/15/2011 0748   CALCIUM 9.4 02/10/2019 0944   CALCIUM 9.1 11/15/2011 0748   GFRNONAA >60 02/10/2019 0944   GFRNONAA >60 11/15/2011 0748   GFRAA >60 02/10/2019 0944   GFRAA >60 11/15/2011 0748    Lipid Panel     Component Value Date/Time   CHOL 126 03/09/2018 1430   TRIG 99 03/09/2018 1430   HDL 59 03/09/2018 1430   CHOLHDL 2.1 03/09/2018 1430   CHOLHDL 2 01/20/2011 1444   VLDL 11.6 01/20/2011 1444   LDLCALC 47 03/09/2018 1430    CBC    Component Value Date/Time   WBC 7.0 02/10/2019 0944   RBC 4.67 02/10/2019 0944   HGB 13.9 02/10/2019 0944   HGB 12.6 03/09/2018 1430   HCT 42.8 02/10/2019 0944   HCT 37.6 03/09/2018  1430   PLT 278 02/10/2019 0944   PLT 281 03/09/2018 1430   MCV 91.6 02/10/2019 0944   MCV 91 03/09/2018 1430   MCV 87 11/15/2011 0748   MCH 29.8 02/10/2019 0944   MCHC 32.5 02/10/2019 0944   RDW 13.5 02/10/2019 0944   RDW 12.9 03/09/2018 1430   RDW 14.4 11/15/2011 0748   LYMPHSABS 0.5 (L) 11/15/2011 0748   MONOABS 1.3 (H) 11/15/2011 0748   EOSABS 0.0 11/15/2011 9233  BASOSABS 0.1 11/15/2011 0748    Hgb A1C Lab Results  Component Value Date   HGBA1C 5.2 06/18/2016       Assessment and Plan:   Follow Up Instructions:    I discussed the assessment and treatment plan with the patient. The patient was provided an opportunity to ask questions and all were answered. The patient agreed with the plan and demonstrated an understanding of the instructions.   The patient was advised to call back or seek an in-person evaluation if the symptoms worsen or if the condition fails to improve as anticipated.     Nicki Reaper, NP

## 2019-06-09 ENCOUNTER — Telehealth: Payer: BC Managed Care – PPO | Admitting: Internal Medicine

## 2019-07-03 ENCOUNTER — Other Ambulatory Visit: Payer: Self-pay | Admitting: Internal Medicine

## 2019-07-04 NOTE — Telephone Encounter (Signed)
IF she is using this frequently, would want to discuss alternative therapy due to addictive nature of this medication.

## 2019-07-04 NOTE — Telephone Encounter (Signed)
Last filled 05/30/2019... please advise

## 2019-07-13 NOTE — Telephone Encounter (Signed)
Patient called, she is requesting a call back from you She stated she is not taking the medication very often. She is going to out of town during the summer and was requesting the refill now so she will make sure she has enough.   She wanted to further explain this to you but she was not able to reply on her my chart

## 2019-10-11 ENCOUNTER — Ambulatory Visit (INDEPENDENT_AMBULATORY_CARE_PROVIDER_SITE_OTHER): Payer: BC Managed Care – PPO

## 2019-10-11 ENCOUNTER — Ambulatory Visit
Admission: EM | Admit: 2019-10-11 | Discharge: 2019-10-11 | Disposition: A | Discharge status: Home / Self Care | Payer: BC Managed Care – PPO | Attending: Family Medicine | Admitting: Family Medicine

## 2019-10-11 ENCOUNTER — Other Ambulatory Visit: Payer: Self-pay

## 2019-10-11 ENCOUNTER — Telehealth: Payer: Self-pay | Admitting: *Deleted

## 2019-10-11 DIAGNOSIS — Z20822 Contact with and (suspected) exposure to covid-19: Secondary | ICD-10-CM | POA: Diagnosis not present

## 2019-10-11 DIAGNOSIS — R05 Cough: Secondary | ICD-10-CM

## 2019-10-11 DIAGNOSIS — J189 Pneumonia, unspecified organism: Secondary | ICD-10-CM | POA: Diagnosis not present

## 2019-10-11 MED ORDER — ALBUTEROL SULFATE HFA 108 (90 BASE) MCG/ACT IN AERS: 1.0000 | INHALATION_SPRAY | Freq: Four times a day (QID) | RESPIRATORY_TRACT | 0 refills | Status: DC | PRN

## 2019-10-11 MED ORDER — KETOROLAC TROMETHAMINE 10 MG PO TABS: 10.0000 mg | ORAL_TABLET | Freq: Four times a day (QID) | ORAL | 0 refills | Status: DC | PRN

## 2019-10-11 MED ORDER — HYDROCOD POLST-CPM POLST ER 10-8 MG/5ML PO SUER: 5.0000 mL | Freq: Two times a day (BID) | ORAL | 0 refills | Status: DC | PRN

## 2019-10-11 NOTE — ED Provider Notes (Signed)
[facility]    CSN: [id number] Arrival date & time: 10/11/19  1656   History   Chief Complaint Chief Complaint  Patient presents with  . Cough  . Otalgia    Left    HPI  44 year old female presents with the above complaints.  Patient reports that her symptoms started on Thursday.  She feels very poorly.  She reports cough, ear pain, loss of smell.  She has a severe cough.  She feels very weak and is experiencing body aches.  No relieving factors.  No reported sick contacts.  Pain 8/10 in severity.  No other complaints at this time.  Past Medical History:  Diagnosis Date  . ADHD   . Allergy    seasonal  . Anemia    vitamin d deficiency  . Anxiety   . Fever blister   . Headache(784.0)    frequent  . Scoliosis     Patient Active Problem List   Diagnosis Date Noted  . GAD (generalized anxiety disorder) 11/10/2018  . PMS (premenstrual syndrome) 10/22/2016  . 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 APPENDECTOMY    . CESAREAN SECTION     1  . DILATATION AND CURETTAGE/HYSTEROSCOPY WITH MINERVA N/A 02/14/2019   Procedure: DILATATION AND CURETTAGE/HYSTEROSCOPY WITH MINERVA;  Surgeon: Hildred Laser, MD;  Location: [facility] ORS;  Service: Gynecology;  Laterality: N/A;  . DILATION AND CURETTAGE OF UTERUS    . [facility]  2011   Miscarrigage in 2011  . IUD REMOVAL N/A 02/14/2019   Procedure: INTRAUTERINE DEVICE (IUD) REMOVAL;  Surgeon: Hildred Laser, MD;  Location: [facility] ORS;  Service: Gynecology;  Laterality: N/A;  . TUBAL LIGATION    . TUBAL LIGATION      OB History    Gravida  3   Para  2   Term  2   Preterm      AB  1   Living  2     SAB  1   TAB      Ectopic      Multiple      Live Births  2            Home Medications    Prior to Admission medications   Medication Sig Start Date End Date Taking? Authorizing Provider  clonazePAM (KLONOPIN) 1 MG tablet TAKE 1 TABLET(1 MG) BY MOUTH DAILY AS  NEEDED FOR ANXIETY 07/13/19  Yes Baity, Salvadore Oxford, NP  valACYclovir (VALTREX) 1000 MG tablet Take 1 tablet (1,000 mg total) by mouth daily as needed (outbreak). 04/04/19  Yes Hildred Laser, MD  albuterol (VENTOLIN HFA) 108 (90 Base) MCG/ACT inhaler Inhale 1-2 puffs into the lungs every 6 (six) hours as needed for wheezing or shortness of breath. 10/11/19   Tommie Sams, DO  chlorpheniramine-HYDROcodone (TUSSIONEX PENNKINETIC ER) 10-8 MG/5ML SUER Take 5 mLs by mouth every 12 (twelve) hours as needed. 10/11/19   Tommie Sams, DO  ketorolac (TORADOL) 10 MG tablet Take 1 tablet (10 mg total) by mouth every 6 (six) hours as needed for moderate pain or severe pain. 10/11/19   Tommie Sams, DO    Family History Family History  Problem Relation Age of Onset  . Diabetes Father   . Hypertension Father   . Breast cancer Neg Hx   . Ovarian cancer Neg Hx   . Colon cancer Neg Hx   . Heart disease Neg Hx     Social  History Social History   Tobacco Use  . Smoking status: Former Smoker    Types: Cigarettes    Quit date: 12/17/2005    Years since quitting: 13.8  . Smokeless tobacco: Never Used  Vaping Use  . Vaping Use: Never used  Substance Use Topics  . Alcohol use: Yes    Comment: occass  . Drug use: No     Allergies   Patient has no known allergies.   Review of Systems Review of Systems  Constitutional: Negative for fever.  HENT: Positive for ear pain.   Respiratory: Positive for cough.   Musculoskeletal:       Body aches.  Neurological: Positive for headaches.   Physical Exam Triage Vital Signs ED Triage Vitals  Enc Vitals Group     BP 10/11/19 1803 113/71     Pulse Rate 10/11/19 1803 90     Resp 10/11/19 1803 18     Temp 10/11/19 1803 98.8 F (37.1 C)     Temp Source 10/11/19 1803 Oral     SpO2 10/11/19 1803 100 %     Weight 10/11/19 1807 132 lb (59.9 kg)     Height 10/11/19 1807 5\' 7"  (1.702 m)     Head Circumference --      Peak Flow --      Pain Score 10/11/19 1806  8     Pain Loc --      Pain Edu? --      Excl. in GC? --    Updated Vital Signs BP 113/71 (BP Location: Left Arm)   Pulse 90   Temp 98.8 F (37.1 C) (Oral)   Resp 18   Ht 5\' 7"  (1.702 m)   Wt 59.9 kg   SpO2 100%   BMI 20.67 kg/m   Visual Acuity Right Eye Distance:   Left Eye Distance:   Bilateral Distance:    Right Eye Near:   Left Eye Near:    Bilateral Near:     Physical Exam Vitals and nursing note reviewed.  Constitutional:      General: She is not in acute distress.    Comments: Appears ill.   HENT:     Head: Normocephalic and atraumatic.     Right Ear: Tympanic membrane normal.     Left Ear: Tympanic membrane normal.     Nose: Nose normal.  Eyes:     General:        Right eye: No discharge.        Left eye: No discharge.     Conjunctiva/sclera: Conjunctivae normal.  Cardiovascular:     Rate and Rhythm: Normal rate and regular rhythm.  Pulmonary:     Effort: Pulmonary effort is normal.     Comments: Coughing throughout auscultation.  Neurological:     Mental Status: She is alert.    UC Treatments / Results  Labs (all labs ordered are listed, but only abnormal results are displayed) Labs Reviewed  SARS CORONAVIRUS 2 (TAT 6-24 HRS)    EKG   Radiology DG Chest 2 View  Result Date: 10/11/2019 CLINICAL DATA:  Cough EXAM: CHEST - 2 VIEW COMPARISON:  None. FINDINGS: Patchy bilateral nodular airspace disease in the mid and lower lungs. Heart is normal size. No effusions or pneumothorax. No acute bony abnormality. Mild rightward scoliosis in the midthoracic spine. IMPRESSION: Patchy nodular airspace disease bilaterally compatible with multifocal pneumonia. COVID pneumonia could have this appearance. Electronically Signed   By: M.D.  On: 10/11/2019 18:57    Procedures Procedures (including critical care time)  Medications Ordered in UC Medications - No data to display  Initial Impression / Assessment and Plan / UC Course  I have  reviewed the triage vital signs and the nursing notes.  Pertinent labs & imaging results that were available during my care of the patient were reviewed by me and considered in my medical decision making (see chart for details).    44 year old female presents with respiratory symptoms.  Patient appears ill.  Chest x-ray obtained and was independently reviewed by me.  Patchy airspace opacities consistent with multifocal pneumonia.  Suspected COVID-19 pneumonia given her symptoms and the appearance of her chest x-ray.  Patient is not hypoxic and is not requiring oxygen at this time.  Advised rest, fluids and supportive care.  Albuterol as needed.  Tussionex as needed for cough.  Toradol for body aches.  Advised to go to the hospital if she worsens.  Final Clinical Impressions(s) / UC Diagnoses   Final diagnoses:  Multifocal pneumonia  Suspected COVID-19 virus infection     Discharge Instructions     Rest. Fluids.  Medication as prescribed.  If you worsen, go to the ER.  Take care  Dr. Adriana Simas    ED Prescriptions    Medication Sig Dispense Auth. Provider   albuterol (VENTOLIN HFA) 108 (90 Base) MCG/ACT inhaler Inhale 1-2 puffs into the lungs every 6 (six) hours as needed for wheezing or shortness of breath. 18 g Brynja Marker G, DO   chlorpheniramine-HYDROcodone (TUSSIONEX PENNKINETIC ER) 10-8 MG/5ML SUER Take 5 mLs by mouth every 12 (twelve) hours as needed. 115 mL Jaeleen Inzunza G, DO   ketorolac (TORADOL) 10 MG tablet Take 1 tablet (10 mg total) by mouth every 6 (six) hours as needed for moderate pain or severe pain. 20 tablet Tommie Sams, DO     PDMP not reviewed this encounter.   Tommie Sams, [place] 10/11/19 2316

## 2019-10-11 NOTE — Discharge Instructions (Signed)
Rest. Fluids.  Medication as prescribed.  If you worsen, go to the ER.  Take care  Dr. Cuyler Vandyken 

## 2019-10-11 NOTE — Telephone Encounter (Signed)
Patient called stating that she has been in bed since Thursday and has only gotten out to go to the  bathroom. Patient stated that she is having ear pain, sore throat, cough, and body aches. Patient stated that when she first started with the cough it was green and now it is clear. Patient stated that she feels terrible. Patient stated that she has not had the covid vaccines. Patient was advised that she needs to be seen and does need a face to face visit. Patient was given information on the [facility]. Patient agreed to go to an urgent care but does not feel that she can wait a long period of time to be seen because she feels so bad. Placed patient on hold while I called the Urgent Care. Spoke to Amy at [facility] and was advised that the wait is only about an hour. Advised patient that the wait at [facility] is not very long so she stated that she is getting ready to get in the car and have her husband drive her there,

## 2019-10-11 NOTE — ED Triage Notes (Signed)
Patient in today w/ c/o of cough and left ear pain. Patient states sx onset was last Thursday. Patient also states loss of smell yesterday.

## 2019-10-11 NOTE — Telephone Encounter (Signed)
Noted, will review UC note 

## 2019-10-12 ENCOUNTER — Telehealth: Payer: Self-pay | Admitting: Emergency Medicine

## 2019-10-12 ENCOUNTER — Other Ambulatory Visit: Payer: Self-pay | Admitting: Oncology

## 2019-10-12 ENCOUNTER — Encounter: Payer: Self-pay | Admitting: Oncology

## 2019-10-12 LAB — SARS CORONAVIRUS 2 (TAT 6-24 HRS): SARS Coronavirus 2: POSITIVE — AB

## 2019-10-12 NOTE — Progress Notes (Signed)
Called to Discuss with patient about Covid symptoms and the use of regeneron, a monoclonal antibody infusion for those with mild to moderate Covid symptoms and at a high risk of hospitalization.     Pt is qualified for this infusion at the [facility] infusion center due to co-morbid conditions and/or a member of an at-risk group.     Unable to reach pt. No vm available.   Mignon Pine, AGNP-C [phone] (Infusion Center Hotline)

## 2019-10-12 NOTE — Telephone Encounter (Signed)
Pt given covid results and verbalized understanding of treatment plan

## 2019-10-13 DIAGNOSIS — Z87891 Personal history of nicotine dependence: Secondary | ICD-10-CM | POA: Diagnosis not present

## 2019-10-13 DIAGNOSIS — R0602 Shortness of breath: Secondary | ICD-10-CM | POA: Diagnosis not present

## 2019-10-13 DIAGNOSIS — R05 Cough: Secondary | ICD-10-CM | POA: Diagnosis not present

## 2019-10-13 DIAGNOSIS — R509 Fever, unspecified: Secondary | ICD-10-CM | POA: Diagnosis not present

## 2019-10-13 DIAGNOSIS — U071 COVID-19: Secondary | ICD-10-CM | POA: Diagnosis not present

## 2019-10-14 ENCOUNTER — Telehealth: Payer: Self-pay | Admitting: *Deleted

## 2019-10-14 DIAGNOSIS — U071 COVID-19: Secondary | ICD-10-CM

## 2019-10-14 MED ORDER — ALBUTEROL SULFATE (2.5 MG/3ML) 0.083% IN NEBU: 2.5000 mg | INHALATION_SOLUTION | Freq: Four times a day (QID) | RESPIRATORY_TRACT | 0 refills | Status: DC | PRN

## 2019-10-14 NOTE — Telephone Encounter (Signed)
Pt's sister called Triage (no DPR). Pt has covid/ pneumonia and has been to an UC and also ER due to Covid sxs. Sister said she has a nebulizer machine and wanted to see if PCP would prescribe a nebulizer solution to go in the machine to help pt. Pt has standard covid sxs. She has SOB and URI sxs and doesn't feel well, pt's sister knows she's not on her DPR but said if we are will send in some medication then we can call pt back directly.   PCP out of the office so will route to Jae Dire, NP

## 2019-10-14 NOTE — Telephone Encounter (Signed)
Spoke with patient via phone, she has a persistent cough and can hardly get through the conversation. I sent the nebulizer solution to her pharmacy as requested.  I was also able to get her scheduled for monoclonal antibody infusion on 10/15/2019.

## 2019-10-15 ENCOUNTER — Other Ambulatory Visit (HOSPITAL_COMMUNITY): Payer: Self-pay | Admitting: Physician Assistant

## 2019-10-15 ENCOUNTER — Ambulatory Visit (HOSPITAL_COMMUNITY)
Admission: RE | Admit: 2019-10-15 | Discharge: 2019-10-15 | Disposition: A | Discharge status: Home / Self Care | Payer: BC Managed Care – PPO | Source: Ambulatory Visit | Attending: Pulmonary Disease | Admitting: Pulmonary Disease

## 2019-10-15 DIAGNOSIS — U071 COVID-19: Secondary | ICD-10-CM | POA: Diagnosis not present

## 2019-10-15 MED ORDER — EPINEPHRINE 0.3 MG/0.3ML IJ SOAJ: 0.3000 mg | Freq: Once | INTRAMUSCULAR | Status: DC | PRN

## 2019-10-15 MED ORDER — METHYLPREDNISOLONE SODIUM SUCC 125 MG IJ SOLR: 125.0000 mg | Freq: Once | INTRAMUSCULAR | Status: DC | PRN

## 2019-10-15 MED ORDER — SODIUM CHLORIDE 0.9 % IV SOLN: INTRAVENOUS | Status: DC | PRN

## 2019-10-15 MED ORDER — FAMOTIDINE IN NACL 20-0.9 MG/50ML-% IV SOLN: 20.0000 mg | Freq: Once | INTRAVENOUS | Status: DC | PRN

## 2019-10-15 MED ORDER — DIPHENHYDRAMINE HCL 50 MG/ML IJ SOLN: 50.0000 mg | Freq: Once | INTRAMUSCULAR | Status: DC | PRN

## 2019-10-15 MED ORDER — ALBUTEROL SULFATE HFA 108 (90 BASE) MCG/ACT IN AERS: 2.0000 | INHALATION_SPRAY | Freq: Once | RESPIRATORY_TRACT | Status: DC | PRN

## 2019-10-15 MED ORDER — SODIUM CHLORIDE 0.9 % IV SOLN
1200.0000 mg | Freq: Once | INTRAVENOUS | Status: AC
  Administered 2019-10-15: 1200 mg via INTRAVENOUS

## 2019-10-15 NOTE — Progress Notes (Signed)
  Diagnosis: COVID-19  Physician: Anabel Halon, MD  Procedure: Covid Infusion Clinic Med: casirivimab\imdevimab infusion - Provided patient with casirivimab\imdevimab fact sheet for patients, parents and caregivers prior to infusion.  Complications: No immediate complications noted.  Discharge: Discharged home   Angelia Mould 10/15/2019

## 2019-10-15 NOTE — Discharge Instructions (Signed)

## 2019-10-19 NOTE — Telephone Encounter (Addendum)
Pt left v/m that pt had monoclonal antibody infusion on 10/15/19 or 10/16/19. Pt wants to know if there is anything else she needs to be doing. [employer] customer is not available and try call later; no v/m or answer. Tried call again with same response;unable at this time to get update on how pt is doing; sending not to Allayne Gitelman NP and [place] CMA and Avi Archuleta LPN.

## 2019-10-19 NOTE — Telephone Encounter (Addendum)
She may need reevaluation. Can we try to call her again tomorrow for an update? FYI to PCP.

## 2019-10-20 NOTE — Telephone Encounter (Signed)
I spoke with pt; pt said she feels alive again; the cough is 95 % gone; but pt still has weakness and fatigue; pt cannot be up a very longtime. Pt has not had to use the nebulizer in couple of days. Pt taking ibuprofen for h/a. No SOB or CP. Pt has not had a fever since beginning of symptoms. Pamala Hurry NP is not in office in AM on 10/21/19. Will send note to Allayne Gitelman NP and Pamala Hurry NP. Pt request cb after reviewed to see if there is anything else pt should do. UC & ED precautions given and pt voiced understanding.

## 2019-10-20 NOTE — Telephone Encounter (Signed)
When was her diagnosis date. Can she be evaluated in office? I could work her in today.

## 2019-10-21 NOTE — Telephone Encounter (Signed)
Pt is aware that there is no know amount of time that anyone can tell her that she will have the fatigue... advice to start Zinc and Vitamin C, stay hydrated, rest and see if that may help... Also pt is aware that she can not be seen in office until after 10/26/2019

## 2020-01-28 IMAGING — MG DIGITAL SCREENING BILAT W/ TOMO W/ CAD
6 of 10 series · 6 of 30 positions shown · non-contrast
Comparison: Previous exam(s).

CLINICAL DATA: Screening.

EXAM:
DIGITAL SCREENING BILATERAL MAMMOGRAM WITH TOMO AND CAD

[R MLO synth-2D (1 of 2)]
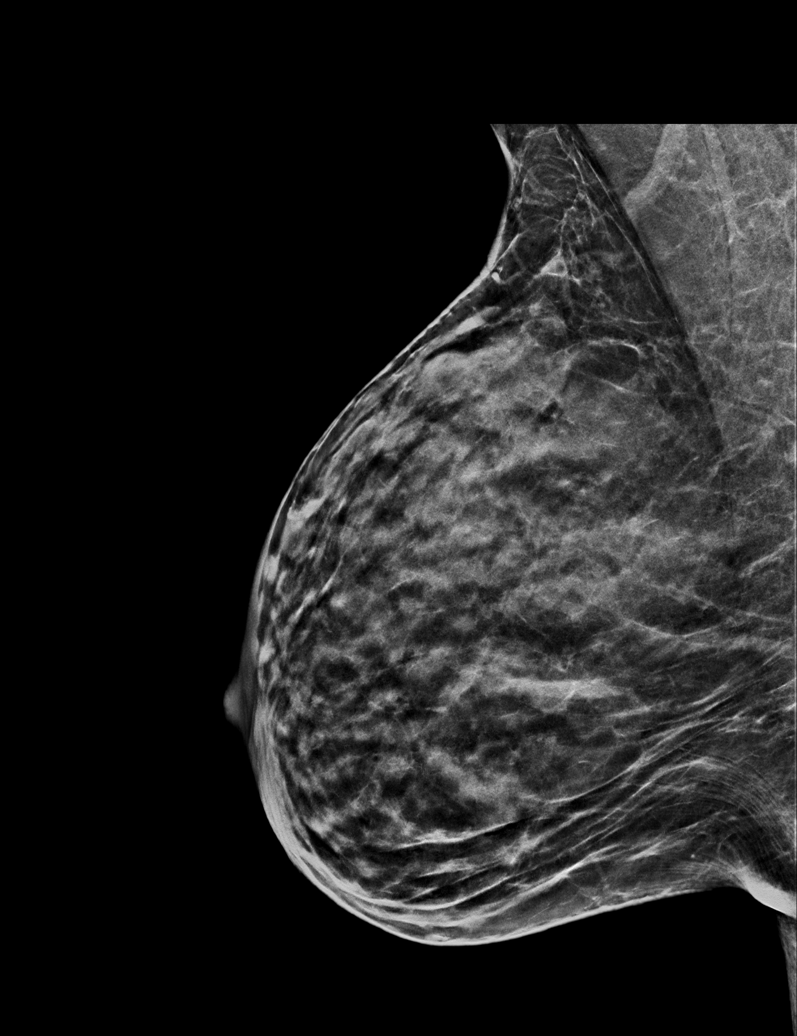

[R MLO synth-2D (2 of 2)]
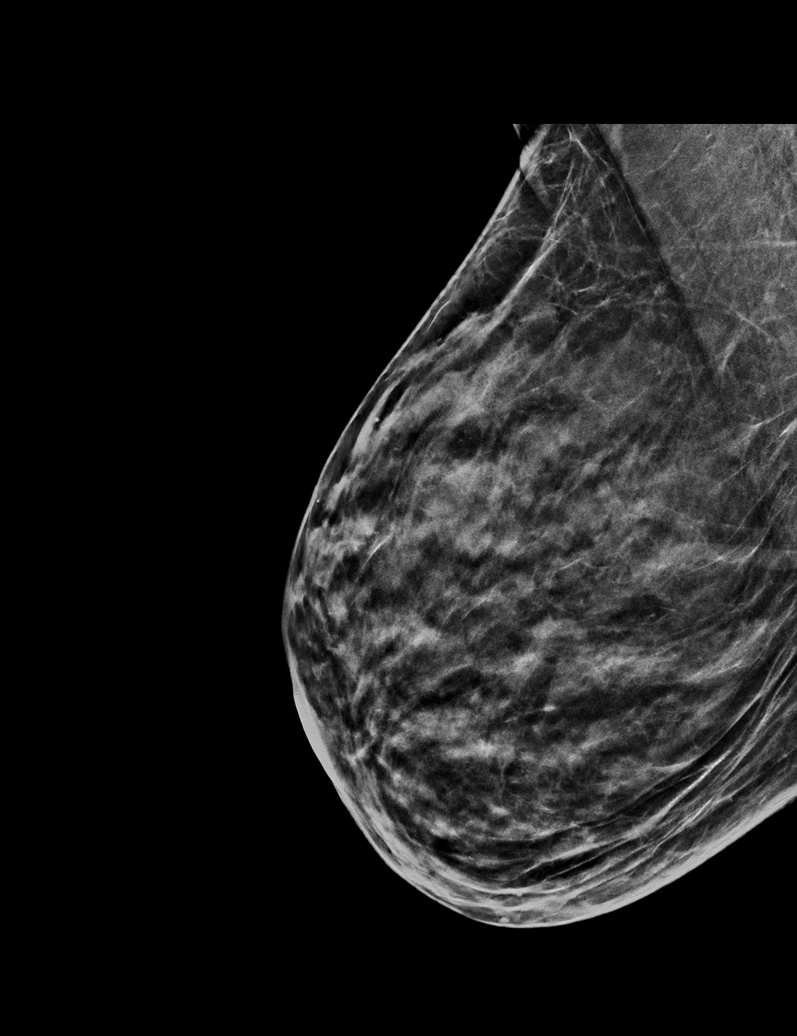

[R CC synth-2D]
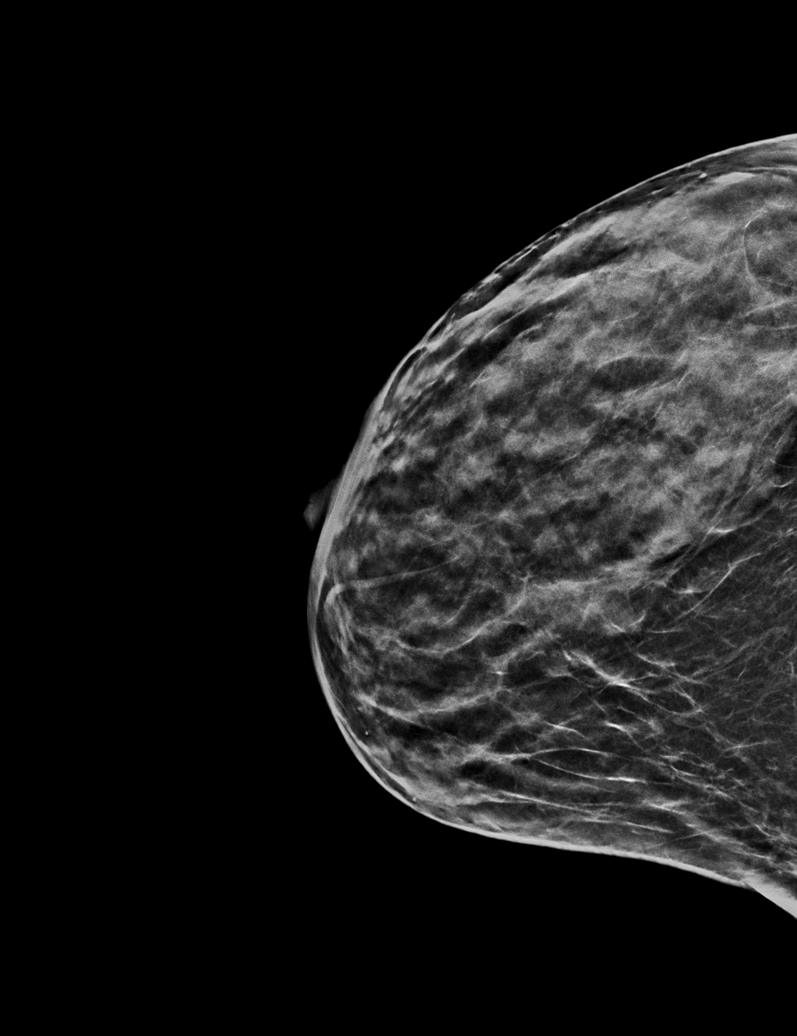

[L CC synth-2D]
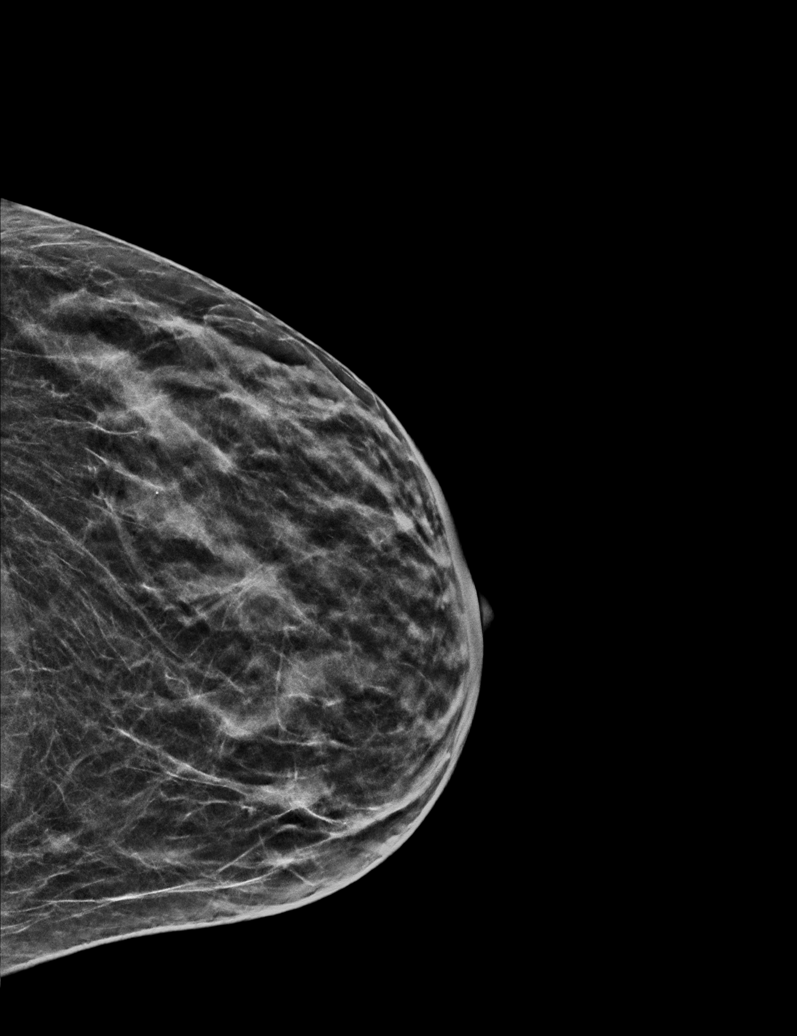

[L MLO synth-2D]
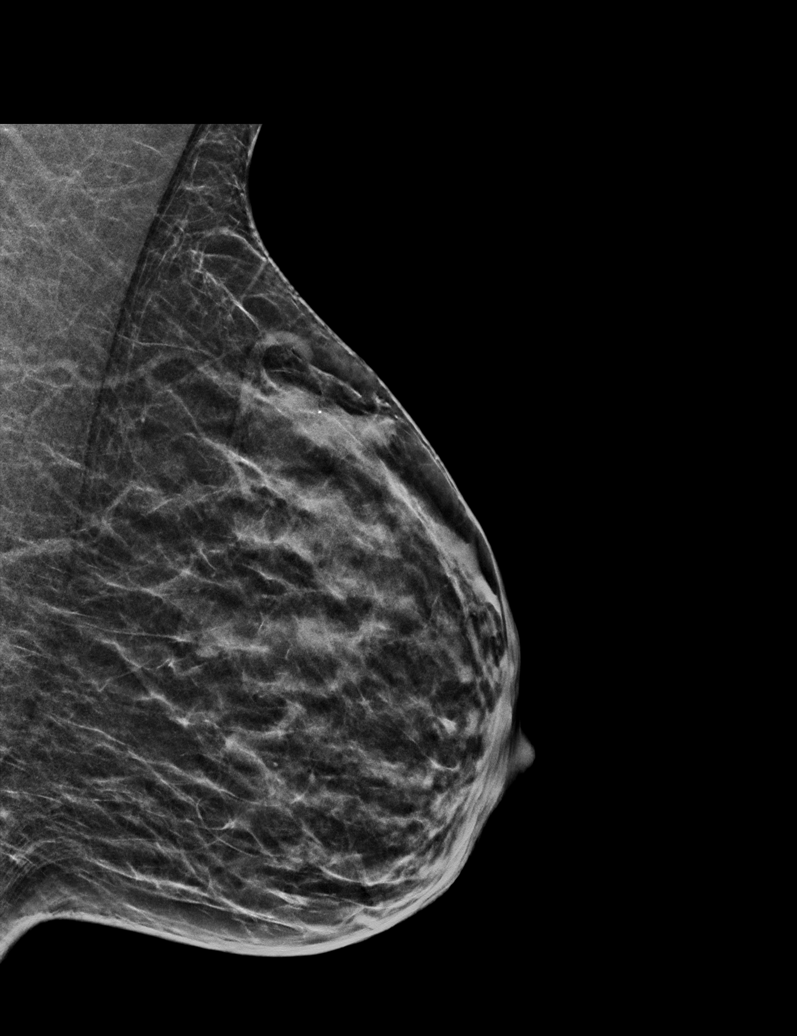

[L MLO tomo · tomo slice 23/45.0]
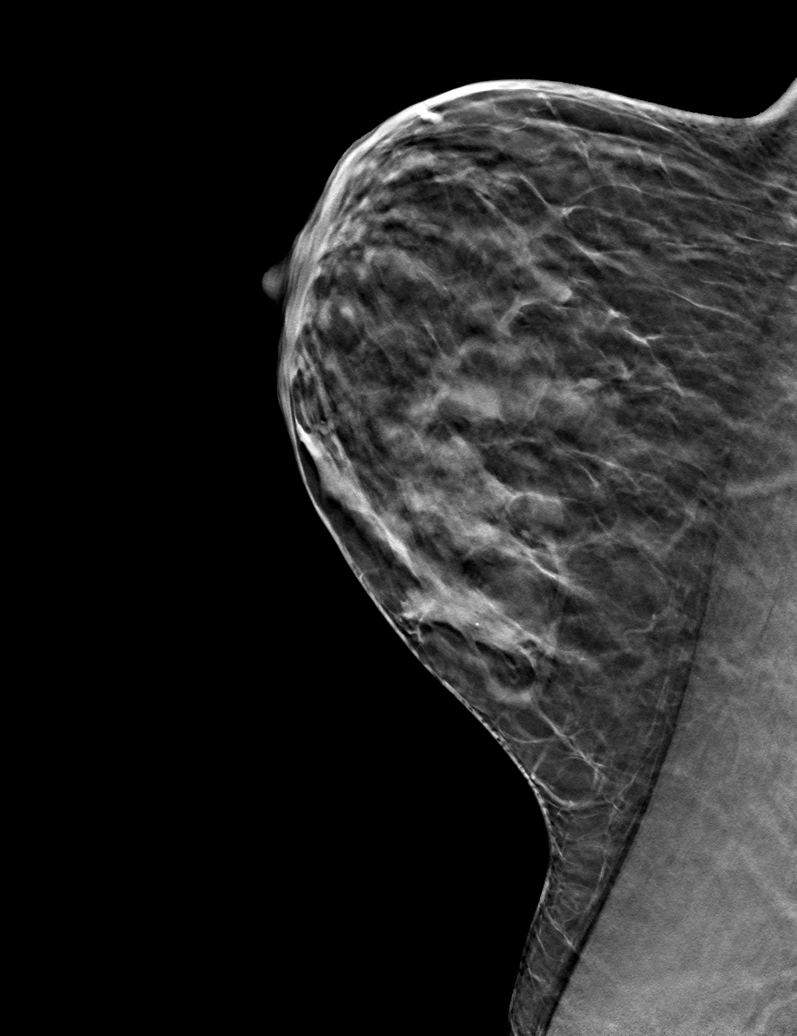

[6 of 30 positions shown; findings below may reference images not displayed]

ACR Breast Density Category c: The breast tissue is heterogeneously
dense, which may obscure small masses.
FINDINGS: In the left breast, a possible asymmetry warrants further
evaluation. In the right breast, no findings suspicious for
malignancy. Images were processed with CAD.
IMPRESSION: Further evaluation is suggested for possible asymmetry in the left
breast.

RECOMMENDATION:
Diagnostic mammogram and possibly ultrasound of the left breast.
(Code:[id number])

The patient will be contacted regarding the findings, and additional
imaging will be scheduled.

BI-RADS CATEGORY  0: Incomplete. Need additional imaging evaluation
and/or prior mammograms for comparison.

## 2020-04-17 HISTORY — PX: AUGMENTATION MAMMAPLASTY: SUR837

## 2020-04-19 ENCOUNTER — Ambulatory Visit (INDEPENDENT_AMBULATORY_CARE_PROVIDER_SITE_OTHER): Payer: BC Managed Care – PPO | Admitting: Obstetrics and Gynecology

## 2020-04-19 ENCOUNTER — Encounter: Payer: Self-pay | Admitting: Obstetrics and Gynecology

## 2020-04-19 ENCOUNTER — Other Ambulatory Visit: Payer: Self-pay

## 2020-04-19 VITALS — BP 115/74 | HR 73 | Ht 67.0 in | Wt 135.4 lb

## 2020-04-19 DIAGNOSIS — Z1231 Encounter for screening mammogram for malignant neoplasm of breast: Secondary | ICD-10-CM | POA: Diagnosis not present

## 2020-04-19 DIAGNOSIS — Z01419 Encounter for gynecological examination (general) (routine) without abnormal findings: Secondary | ICD-10-CM

## 2020-04-19 DIAGNOSIS — E559 Vitamin D deficiency, unspecified: Secondary | ICD-10-CM

## 2020-04-19 DIAGNOSIS — Z9889 Other specified postprocedural states: Secondary | ICD-10-CM

## 2020-04-19 DIAGNOSIS — N907 Vulvar cyst: Secondary | ICD-10-CM

## 2020-04-19 DIAGNOSIS — B009 Herpesviral infection, unspecified: Secondary | ICD-10-CM

## 2020-04-19 MED ORDER — VALACYCLOVIR HCL 1 G PO TABS: 1000.0000 mg | ORAL_TABLET | Freq: Every day | ORAL | 6 refills | Status: DC | PRN

## 2020-04-19 NOTE — Patient Instructions (Addendum)
Preventive Care 11-44 Years Old, Female Preventive care refers to lifestyle choices and visits with your health care provider that can promote health and wellness. This includes:  A yearly physical exam. This is also called an annual wellness visit.  Regular dental and eye exams.  Immunizations.  Screening for certain conditions.  Healthy lifestyle choices, such as: ? Eating a healthy diet. ? Getting regular exercise. ? Not using drugs or products that contain nicotine and tobacco. ? Limiting alcohol use. What can I expect for my preventive care visit? Physical exam Your health care provider may check your:  Height and weight. These may be used to calculate your BMI (body mass index). BMI is a measurement that tells if you are at a healthy weight.  Heart rate and blood pressure.  Body temperature.  Skin for abnormal spots. Counseling Your health care provider may ask you questions about your:  Past medical problems.  Family's medical history.  Alcohol, tobacco, and drug use.  Emotional well-being.  Home life and relationship well-being.  Sexual activity.  Diet, exercise, and sleep habits.  Work and work Statistician.  Access to firearms.  Method of birth control.  Menstrual cycle.  Pregnancy history. What immunizations do I need? Vaccines are usually given at various ages, according to a schedule. Your health care provider will recommend vaccines for you based on your age, medical history, and lifestyle or other factors, such as travel or where you work.   What tests do I need? Blood tests  Lipid and cholesterol levels. These may be checked every 5 years starting at age 64.  Hepatitis C test.  Hepatitis B test. Screening  Diabetes screening. This is done by checking your blood sugar (glucose) after you have not eaten for a while (fasting).  STD (sexually transmitted disease) testing, if you are at risk.  BRCA-related cancer screening. This may  be done if you have a family history of breast, ovarian, tubal, or peritoneal cancers.  Pelvic exam and Pap test. This may be done every 3 years starting at age 61. Starting at age 82, this may be done every 5 years if you have a Pap test in combination with an HPV test. Talk with your health care provider about your test results, treatment options, and if necessary, the need for more tests.   Follow these instructions at home: Eating and drinking  Eat a healthy diet that includes fresh fruits and vegetables, whole grains, lean protein, and low-fat dairy products.  Take vitamin and mineral supplements as recommended by your health care provider.  Do not drink alcohol if: ? Your health care provider tells you not to drink. ? You are pregnant, may be pregnant, or are planning to become pregnant.  If you drink alcohol: ? Limit how much you have to 0-1 drink a day. ? Be aware of how much alcohol is in your drink. In the [place] one drink equals one 12 oz bottle of beer (355 mL), one 5 oz glass of wine (148 mL), or one 1 oz glass of hard liquor (44 mL).   Lifestyle  Take daily care of your teeth and gums. Brush your teeth every morning and night with fluoride toothpaste. Floss one time each day.  Stay active. Exercise for at least 30 minutes 5 or more days each week.  Do not use any products that contain nicotine or tobacco, such as cigarettes, e-cigarettes, and chewing tobacco. If you need help quitting, ask your health care provider.  Do  not use drugs.  If you are sexually active, practice safe sex. Use a condom or other form of protection to prevent STIs (sexually transmitted infections).  If you do not wish to become pregnant, use a form of birth control. If you plan to become pregnant, see your health care provider for a prepregnancy visit.  Find healthy ways to cope with stress, such as: ? Meditation, yoga, or listening to music. ? Journaling. ? Talking to a trusted  person. ? Spending time with friends and family. Safety  Always wear your seat belt while driving or riding in a vehicle.  Do not drive: ? If you have been drinking alcohol. Do not ride with someone who has been drinking. ? When you are tired or distracted. ? While texting.  Wear a helmet and other protective equipment during sports activities.  If you have firearms in your house, make sure you follow all gun safety procedures.  Seek help if you have been physically or sexually abused. What's next?  Go to your health care provider once a year for an annual wellness visit.  Ask your health care provider how often you should have your eyes and teeth checked.  Stay up to date on all vaccines. This information is not intended to replace advice given to you by your health care provider. Make sure you discuss any questions you have with your health care provider. Document Revised: 10/02/2019 Document Reviewed: 10/15/2017 Elsevier Patient Education  2021 [employer].

## 2020-04-19 NOTE — Progress Notes (Signed)
GYNECOLOGY ANNUAL PHYSICAL EXAM PROGRESS NOTE  Subjective:    Hannah Mason is a 45 y.o. G26P2012 married female who presents for an annual exam. She was previously a patient of [name] Sewell, CNM.  The patient is sexually active. GYN screening history: The patient wears seatbelts: yes. The patient participates in regular exercise: not asked. Has the patient ever been transfused or tattooed?: no.   The patient has the following complaints today:  1) She reports a bump on her vagina, recently noticed by her boyfriend.  Is non-tender.     Menstrual History: Menarche age: 80 Patient's last menstrual period was 12/23/2018 (approximate).     Gynecologic History Contraception: tubal ligation History of STI's: Denies Last Pap: 03/09/2018. Results were: normal.  Denies h/o abnormal pap smears. Last mammogram: 01/31/2019. Results were: BIRADS 2 (left breast mass at 12 o'clock consistent with fibrocystic changes).    OB History  Gravida Para Term Preterm AB Living  3 2 2  0 1 2  SAB IAB Ectopic Multiple Live Births  1 0 0 0 2    # Outcome Date GA Lbr Len/2nd Weight Sex Delivery Anes PTL Lv  3 Term 2012   6 lb 14.4 oz (3.13 kg) F CS-LTranv   LIV  2 SAB 2010          1 Term 2007   6 lb 2.2 oz (2.785 kg) F CS-LTranv   LIV    Past Medical History:  Diagnosis Date  . ADHD   . Allergy    seasonal  . Anemia    vitamin d deficiency  . Anxiety   . COVID-19   . Fever blister   . Headache(784.0)    frequent  . Scoliosis     Past Surgical History:  Procedure Laterality Date  . APPENDECTOMY    . CESAREAN SECTION     1  . DILATATION AND CURETTAGE/HYSTEROSCOPY WITH MINERVA N/A 02/14/2019   Procedure: DILATATION AND CURETTAGE/HYSTEROSCOPY WITH MINERVA;  Surgeon: 02/16/2019, MD;  Location: [facility] ORS;  Service: Gynecology;  Laterality: N/A;  . DILATION AND CURETTAGE OF UTERUS    . [facility]  2011   Miscarrigage in 2011  . IUD REMOVAL N/A 02/14/2019   Procedure: INTRAUTERINE DEVICE  (IUD) REMOVAL;  Surgeon: 02/16/2019, MD;  Location: [facility] ORS;  Service: Gynecology;  Laterality: N/A;  . TUBAL LIGATION    . TUBAL LIGATION      Family History  Problem Relation Age of Onset  . Diabetes Father   . Hypertension Father   . Dementia Mother   . Breast cancer Neg Hx   . Ovarian cancer Neg Hx   . Colon cancer Neg Hx   . Heart disease Neg Hx     Social History   Socioeconomic History  . Marital status: Married    Spouse name: Hildred Laser  . Number of children: 4  . Years of education: Not on file  . Highest education level: Not on file  Occupational History  . Occupation: [employer]  Tobacco Use  . Smoking status: Former Smoker    Types: Cigarettes    Quit date: 12/17/2005    Years since quitting: 14.3  . Smokeless tobacco: Never Used  Vaping Use  . Vaping Use: Never used  Substance and Sexual Activity  . Alcohol use: Yes    Comment: occass  . Drug use: No  . Sexual activity: Yes    Comment: iud to be removed 02/14/19  Other Topics Concern  .  Not on file  Social History Narrative   Lives with husband in [place], 4 children in home (14,12,5,71months). 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   Social Determinants of Health   Financial Resource Strain: Not on file  Food Insecurity: Not on file  Transportation Needs: Not on file  Physical Activity: Not on file  Stress: Not on file  Social Connections: Not on file  Intimate Partner Violence: Not on file    Current Outpatient Medications on File Prior to Visit  Medication Sig Dispense Refill  . clonazePAM (KLONOPIN) 1 MG tablet TAKE 1 TABLET(1 MG) BY MOUTH DAILY AS NEEDED FOR ANXIETY 20 tablet 0  . albuterol (PROVENTIL) (2.5 MG/3ML) 0.083% nebulizer solution Take 3 mLs (2.5 mg total) by nebulization every 6 (six) hours as needed for wheezing or shortness of breath. (Patient not taking: Reported on 04/19/2020) 120 mL 0  . albuterol (VENTOLIN HFA) 108 (90 Base) MCG/ACT inhaler Inhale 1-2  puffs into the lungs every 6 (six) hours as needed for wheezing or shortness of breath. (Patient not taking: Reported on 04/19/2020) 18 g 0  . chlorpheniramine-HYDROcodone (TUSSIONEX PENNKINETIC ER) 10-8 MG/5ML SUER Take 5 mLs by mouth every 12 (twelve) hours as needed. (Patient not taking: Reported on 04/19/2020) 115 mL 0  . ketorolac (TORADOL) 10 MG tablet Take 1 tablet (10 mg total) by mouth every 6 (six) hours as needed for moderate pain or severe pain. (Patient not taking: Reported on 04/19/2020) 20 tablet 0   No current facility-administered medications on file prior to visit.    No Known Allergies    Review of Systems Constitutional: negative for chills, fatigue, fevers and sweats Eyes: negative for irritation, redness and visual disturbance Ears, nose, mouth, throat, and face: negative for hearing loss, nasal congestion, snoring and tinnitus Respiratory: negative for asthma, cough, sputum Cardiovascular: negative for chest pain, dyspnea, exertional chest pressure/discomfort, irregular heart beat, palpitations and syncope Gastrointestinal: negative for abdominal pain, change in bowel habits, nausea and vomiting Genitourinary: see HPI.  Negative for abnormal menstrual periods, sexual problems and vaginal discharge, dysuria and urinary incontinence Integument/breast: negative for breast lump, breast tenderness and nipple discharge Hematologic/lymphatic: negative for bleeding and easy bruising Musculoskeletal:negative for back pain and muscle weakness Neurological: negative for dizziness, headaches, vertigo and weakness Endocrine: negative for diabetic symptoms including polydipsia, polyuria and skin dryness Allergic/Immunologic: negative for hay fever and urticaria      Objective:  Blood pressure 115/74, pulse 73, height 5\' 7"  (1.702 m), weight 135 lb 6.4 oz (61.4 kg). Body mass index is 21.21 kg/m.  General Appearance:    Alert, cooperative, no distress, appears stated age  Head:     Normocephalic, without obvious abnormality, atraumatic  Eyes:    PERRL, conjunctiva/corneas clear, EOM's intact, both eyes  Ears:    Normal external ear canals, both ears  Nose:   Nares normal, septum midline, mucosa normal, no drainage or sinus tenderness  Throat:   Lips, mucosa, and tongue normal; teeth and gums normal  Neck:   Supple, symmetrical, trachea midline, no adenopathy; thyroid: no enlargement/tenderness/nodules; no carotid bruit or JVD  Back:     Symmetric, no curvature, ROM normal, no CVA tenderness  Lungs:     Clear to auscultation bilaterally, respirations unlabored  Chest Wall:    No tenderness or deformity   Heart:    Regular rate and rhythm, S1 and S2 normal, no murmur, rub or gallop  Breast Exam:    No tenderness, masses,  or nipple abnormality  Abdomen:     Soft, non-tender, bowel sounds active all four quadrants, no masses, no organomegaly.    Genitalia:    Pelvic:external genitalia appears normal, with exception of pinpoint papule beneath the skin on rigth labia minora. Vagina without lesions, discharge, or tenderness, rectovaginal septum  normal. Cervix normal in appearance, no cervical motion tenderness, no adnexal masses or tenderness.  Uterus normal size, shape, mobile, regular contours, nontender.  Rectal:    Normal external sphincter.  No hemorrhoids appreciated. Internal exam not done.   Extremities:   Extremities normal, atraumatic, no cyanosis or edema  Pulses:   2+ and symmetric all extremities  Skin:   Skin color, texture, turgor normal, no rashes or lesions  Lymph nodes:   Cervical, supraclavicular, and axillary nodes normal  Neurologic:   CNII-XII intact, normal strength, sensation and reflexes throughout   .  Labs:  Lab Results  Component Value Date   WBC 7.0 02/10/2019   HGB 13.9 02/10/2019   HCT 42.8 02/10/2019   MCV 91.6 02/10/2019   PLT 278 02/10/2019    Lab Results  Component Value Date   CREATININE 0.66 02/10/2019   BUN 16 02/10/2019   NA  139 02/10/2019   K 3.8 02/10/2019   CL 105 02/10/2019   CO2 27 02/10/2019    Lab Results  Component Value Date   ALT 14 03/09/2018   AST 16 03/09/2018   ALKPHOS 56 03/09/2018   BILITOT 0.3 03/09/2018    Lab Results  Component Value Date   TSH 1.510 03/09/2018     Assessment:   1. Encounter for well woman exam with routine gynecological exam   2. Breast cancer screening by mammogram   3. Vitamin D insufficiency   4. Inclusion cyst of vulva   5. S/P endometrial ablation   6. HSV-1 infection     Plan:    Blood tests: see orders. Breast self exam technique reviewed and patient encouraged to perform self-exam monthly. Contraception: tubal ligation. Discussed healthy lifestyle modifications. Mammogram ordered. Pap smear up to date. COVID vaccine declined  Flu vaccine declined.  Reassured patient that small labial nodule was likely an inclusion cyst. No treatment needed, is benign.  Patient desires refill on Ayclovir. Will prescribe.  H/o endometrial ablation, doing well, no issues with bleeding or pain.  Follow up in 1 year for annual exam.     Hildred Laser, MD [facility]

## 2020-04-19 NOTE — Progress Notes (Signed)
Annual Exam-pt stated that she was doing well.

## 2020-04-30 ENCOUNTER — Other Ambulatory Visit: Payer: BC Managed Care – PPO

## 2020-04-30 ENCOUNTER — Other Ambulatory Visit: Payer: Self-pay

## 2020-04-30 DIAGNOSIS — E559 Vitamin D deficiency, unspecified: Secondary | ICD-10-CM | POA: Diagnosis not present

## 2020-04-30 DIAGNOSIS — Z01419 Encounter for gynecological examination (general) (routine) without abnormal findings: Secondary | ICD-10-CM | POA: Diagnosis not present

## 2020-05-01 LAB — COMPREHENSIVE METABOLIC PANEL
ALT: 16 IU/L (ref 0–32)
AST: 16 IU/L (ref 0–40)
Albumin/Globulin Ratio: 1.6 (ref 1.2–2.2)
Albumin: 4.1 g/dL (ref 3.8–4.8)
Alkaline Phosphatase: 54 IU/L (ref 44–121)
BUN/Creatinine Ratio: 13 (ref 9–23)
BUN: 11 mg/dL (ref 6–24)
Bilirubin Total: 0.2 mg/dL (ref 0.0–1.2)
CO2: 20 mmol/L (ref 20–29)
Calcium: 8.9 mg/dL (ref 8.7–10.2)
Chloride: 107 mmol/L — ABNORMAL HIGH (ref 96–106)
Creatinine, Ser: 0.87 mg/dL (ref 0.57–1.00)
Globulin, Total: 2.5 g/dL (ref 1.5–4.5)
Glucose: 89 mg/dL (ref 65–99)
Potassium: 4.2 mmol/L (ref 3.5–5.2)
Sodium: 142 mmol/L (ref 134–144)
Total Protein: 6.6 g/dL (ref 6.0–8.5)
eGFR: 84 mL/min/{1.73_m2} (ref >59)

## 2020-05-01 LAB — CBC
Hematocrit: 38.3 % (ref 34.0–46.6)
Hemoglobin: 12.6 g/dL (ref 11.1–15.9)
MCH: 30.1 pg (ref 26.6–33.0)
MCHC: 32.9 g/dL (ref 31.5–35.7)
MCV: 92 fL (ref 79–97)
Platelets: 249 10*3/uL (ref 150–450)
RBC: 4.18 x10E6/uL (ref 3.77–5.28)
RDW: 13.1 % (ref 11.7–15.4)
WBC: 5.9 10*3/uL (ref 3.4–10.8)

## 2020-05-01 LAB — LIPID PANEL
Chol/HDL Ratio: 1.8 ratio (ref 0.0–4.4)
Cholesterol, Total: 107 mg/dL (ref 100–199)
HDL: 58 mg/dL (ref >39)
LDL Chol Calc (NIH): 37 mg/dL (ref 0–99)
Triglycerides: 46 mg/dL (ref 0–149)
VLDL Cholesterol Cal: 12 mg/dL (ref 5–40)

## 2020-05-01 LAB — TSH: TSH: 2.49 u[IU]/mL (ref 0.450–4.500)

## 2020-05-01 LAB — VITAMIN D 25 HYDROXY (VIT D DEFICIENCY, FRACTURES): Vit D, 25-Hydroxy: 31.2 ng/mL (ref 30.0–100.0)

## 2020-05-07 ENCOUNTER — Inpatient Hospital Stay: Admission: RE | Admit: 2020-05-07 | Payer: BC Managed Care – PPO | Source: Ambulatory Visit

## 2020-05-14 DIAGNOSIS — M9902 Segmental and somatic dysfunction of thoracic region: Secondary | ICD-10-CM | POA: Diagnosis not present

## 2020-05-14 DIAGNOSIS — M5432 Sciatica, left side: Secondary | ICD-10-CM | POA: Diagnosis not present

## 2020-05-14 DIAGNOSIS — M546 Pain in thoracic spine: Secondary | ICD-10-CM | POA: Diagnosis not present

## 2020-05-14 DIAGNOSIS — M9903 Segmental and somatic dysfunction of lumbar region: Secondary | ICD-10-CM | POA: Diagnosis not present

## 2020-05-16 ENCOUNTER — Other Ambulatory Visit: Payer: Self-pay

## 2020-05-16 ENCOUNTER — Ambulatory Visit
Admission: RE | Admit: 2020-05-16 | Discharge: 2020-05-16 | Disposition: A | Discharge status: Home / Self Care | Payer: BC Managed Care – PPO | Source: Ambulatory Visit | Attending: Obstetrics and Gynecology | Admitting: Obstetrics and Gynecology

## 2020-05-16 DIAGNOSIS — M546 Pain in thoracic spine: Secondary | ICD-10-CM | POA: Diagnosis not present

## 2020-05-16 DIAGNOSIS — M9903 Segmental and somatic dysfunction of lumbar region: Secondary | ICD-10-CM | POA: Diagnosis not present

## 2020-05-16 DIAGNOSIS — M9902 Segmental and somatic dysfunction of thoracic region: Secondary | ICD-10-CM | POA: Diagnosis not present

## 2020-05-16 DIAGNOSIS — Z1231 Encounter for screening mammogram for malignant neoplasm of breast: Secondary | ICD-10-CM | POA: Insufficient documentation

## 2020-05-16 DIAGNOSIS — M5432 Sciatica, left side: Secondary | ICD-10-CM | POA: Diagnosis not present

## 2020-05-21 DIAGNOSIS — M5432 Sciatica, left side: Secondary | ICD-10-CM | POA: Diagnosis not present

## 2020-05-21 DIAGNOSIS — M9903 Segmental and somatic dysfunction of lumbar region: Secondary | ICD-10-CM | POA: Diagnosis not present

## 2020-05-21 DIAGNOSIS — M9902 Segmental and somatic dysfunction of thoracic region: Secondary | ICD-10-CM | POA: Diagnosis not present

## 2020-05-21 DIAGNOSIS — M546 Pain in thoracic spine: Secondary | ICD-10-CM | POA: Diagnosis not present

## 2020-05-23 DIAGNOSIS — M9902 Segmental and somatic dysfunction of thoracic region: Secondary | ICD-10-CM | POA: Diagnosis not present

## 2020-05-23 DIAGNOSIS — M546 Pain in thoracic spine: Secondary | ICD-10-CM | POA: Diagnosis not present

## 2020-05-23 DIAGNOSIS — M5432 Sciatica, left side: Secondary | ICD-10-CM | POA: Diagnosis not present

## 2020-05-23 DIAGNOSIS — M9903 Segmental and somatic dysfunction of lumbar region: Secondary | ICD-10-CM | POA: Diagnosis not present

## 2020-05-28 DIAGNOSIS — M9903 Segmental and somatic dysfunction of lumbar region: Secondary | ICD-10-CM | POA: Diagnosis not present

## 2020-05-28 DIAGNOSIS — M5432 Sciatica, left side: Secondary | ICD-10-CM | POA: Diagnosis not present

## 2020-05-28 DIAGNOSIS — M9902 Segmental and somatic dysfunction of thoracic region: Secondary | ICD-10-CM | POA: Diagnosis not present

## 2020-05-28 DIAGNOSIS — M546 Pain in thoracic spine: Secondary | ICD-10-CM | POA: Diagnosis not present

## 2020-06-11 DIAGNOSIS — M5432 Sciatica, left side: Secondary | ICD-10-CM | POA: Diagnosis not present

## 2020-06-11 DIAGNOSIS — M546 Pain in thoracic spine: Secondary | ICD-10-CM | POA: Diagnosis not present

## 2020-06-11 DIAGNOSIS — M9903 Segmental and somatic dysfunction of lumbar region: Secondary | ICD-10-CM | POA: Diagnosis not present

## 2020-06-11 DIAGNOSIS — M9902 Segmental and somatic dysfunction of thoracic region: Secondary | ICD-10-CM | POA: Diagnosis not present

## 2020-06-13 DIAGNOSIS — M5432 Sciatica, left side: Secondary | ICD-10-CM | POA: Diagnosis not present

## 2020-06-13 DIAGNOSIS — M546 Pain in thoracic spine: Secondary | ICD-10-CM | POA: Diagnosis not present

## 2020-06-13 DIAGNOSIS — M9902 Segmental and somatic dysfunction of thoracic region: Secondary | ICD-10-CM | POA: Diagnosis not present

## 2020-06-13 DIAGNOSIS — M9903 Segmental and somatic dysfunction of lumbar region: Secondary | ICD-10-CM | POA: Diagnosis not present

## 2020-07-11 ENCOUNTER — Encounter: Payer: BC Managed Care – PPO | Admitting: Obstetrics and Gynecology

## 2020-08-13 DIAGNOSIS — M9903 Segmental and somatic dysfunction of lumbar region: Secondary | ICD-10-CM | POA: Diagnosis not present

## 2020-08-13 DIAGNOSIS — M9902 Segmental and somatic dysfunction of thoracic region: Secondary | ICD-10-CM | POA: Diagnosis not present

## 2020-08-13 DIAGNOSIS — M546 Pain in thoracic spine: Secondary | ICD-10-CM | POA: Diagnosis not present

## 2020-08-13 DIAGNOSIS — M5432 Sciatica, left side: Secondary | ICD-10-CM | POA: Diagnosis not present

## 2020-09-12 DIAGNOSIS — M9902 Segmental and somatic dysfunction of thoracic region: Secondary | ICD-10-CM | POA: Diagnosis not present

## 2020-09-12 DIAGNOSIS — M5432 Sciatica, left side: Secondary | ICD-10-CM | POA: Diagnosis not present

## 2020-09-12 DIAGNOSIS — M546 Pain in thoracic spine: Secondary | ICD-10-CM | POA: Diagnosis not present

## 2020-09-12 DIAGNOSIS — M9903 Segmental and somatic dysfunction of lumbar region: Secondary | ICD-10-CM | POA: Diagnosis not present

## 2020-10-11 DIAGNOSIS — L821 Other seborrheic keratosis: Secondary | ICD-10-CM | POA: Diagnosis not present

## 2020-10-11 DIAGNOSIS — D225 Melanocytic nevi of trunk: Secondary | ICD-10-CM | POA: Diagnosis not present

## 2020-10-11 DIAGNOSIS — L578 Other skin changes due to chronic exposure to nonionizing radiation: Secondary | ICD-10-CM | POA: Diagnosis not present

## 2021-02-13 DIAGNOSIS — M9902 Segmental and somatic dysfunction of thoracic region: Secondary | ICD-10-CM | POA: Diagnosis not present

## 2021-02-13 DIAGNOSIS — M9903 Segmental and somatic dysfunction of lumbar region: Secondary | ICD-10-CM | POA: Diagnosis not present

## 2021-02-13 DIAGNOSIS — M546 Pain in thoracic spine: Secondary | ICD-10-CM | POA: Diagnosis not present

## 2021-02-13 DIAGNOSIS — M5432 Sciatica, left side: Secondary | ICD-10-CM | POA: Diagnosis not present

## 2021-03-26 ENCOUNTER — Other Ambulatory Visit: Payer: Self-pay | Admitting: Obstetrics and Gynecology

## 2021-03-26 DIAGNOSIS — Z1231 Encounter for screening mammogram for malignant neoplasm of breast: Secondary | ICD-10-CM

## 2021-04-30 ENCOUNTER — Ambulatory Visit
Admission: RE | Admit: 2021-04-30 | Discharge: 2021-04-30 | Disposition: A | Discharge status: Home / Self Care | Payer: BC Managed Care – PPO | Source: Ambulatory Visit | Attending: Obstetrics and Gynecology | Admitting: Obstetrics and Gynecology

## 2021-04-30 ENCOUNTER — Other Ambulatory Visit: Payer: Self-pay

## 2021-04-30 DIAGNOSIS — Z1231 Encounter for screening mammogram for malignant neoplasm of breast: Secondary | ICD-10-CM | POA: Diagnosis not present

## 2021-07-03 ENCOUNTER — Encounter: Payer: Self-pay | Admitting: Internal Medicine

## 2021-07-04 ENCOUNTER — Other Ambulatory Visit: Payer: Self-pay | Admitting: Internal Medicine

## 2021-07-04 MED ORDER — FLUCONAZOLE 150 MG PO TABS: 150.0000 mg | ORAL_TABLET | Freq: Once | ORAL | 0 refills | Status: AC

## 2021-09-03 DIAGNOSIS — M25512 Pain in left shoulder: Secondary | ICD-10-CM | POA: Diagnosis not present

## 2021-09-03 DIAGNOSIS — S46912A Strain of unspecified muscle, fascia and tendon at shoulder and upper arm level, left arm, initial encounter: Secondary | ICD-10-CM | POA: Diagnosis not present

## 2021-09-06 ENCOUNTER — Telehealth: Payer: Self-pay | Admitting: Obstetrics and Gynecology

## 2021-09-09 NOTE — Telephone Encounter (Signed)
Patient needs annual exam prior to next refill.  

## 2021-09-12 NOTE — Telephone Encounter (Signed)
Called patient, No answer, Left VM with appointment for an annual exam on august 4th,2023. Informed patient in order to receive further refills she will need an annual exam. Informed patient via VM if date and time listed above does not work with her schedule to please contact office for rescheduling.

## 2021-09-17 DIAGNOSIS — M25512 Pain in left shoulder: Secondary | ICD-10-CM | POA: Diagnosis not present

## 2021-09-17 DIAGNOSIS — M25612 Stiffness of left shoulder, not elsewhere classified: Secondary | ICD-10-CM | POA: Diagnosis not present

## 2021-09-17 DIAGNOSIS — M7542 Impingement syndrome of left shoulder: Secondary | ICD-10-CM | POA: Diagnosis not present

## 2021-09-20 ENCOUNTER — Encounter: Payer: BC Managed Care – PPO | Admitting: Obstetrics and Gynecology

## 2021-09-25 DIAGNOSIS — M7542 Impingement syndrome of left shoulder: Secondary | ICD-10-CM | POA: Diagnosis not present

## 2021-09-25 DIAGNOSIS — M25512 Pain in left shoulder: Secondary | ICD-10-CM | POA: Diagnosis not present

## 2021-09-25 DIAGNOSIS — M25612 Stiffness of left shoulder, not elsewhere classified: Secondary | ICD-10-CM | POA: Diagnosis not present

## 2021-10-16 DIAGNOSIS — M25612 Stiffness of left shoulder, not elsewhere classified: Secondary | ICD-10-CM | POA: Diagnosis not present

## 2021-10-16 DIAGNOSIS — M25512 Pain in left shoulder: Secondary | ICD-10-CM | POA: Diagnosis not present

## 2021-10-30 DIAGNOSIS — M25512 Pain in left shoulder: Secondary | ICD-10-CM | POA: Diagnosis not present

## 2021-11-11 DIAGNOSIS — M67812 Other specified disorders of synovium, left shoulder: Secondary | ICD-10-CM | POA: Diagnosis not present

## 2022-03-24 DIAGNOSIS — Z713 Dietary counseling and surveillance: Secondary | ICD-10-CM | POA: Diagnosis not present

## 2022-03-26 ENCOUNTER — Other Ambulatory Visit: Payer: Self-pay | Admitting: Internal Medicine

## 2022-04-03 ENCOUNTER — Encounter: Payer: Self-pay | Admitting: Internal Medicine

## 2022-04-03 ENCOUNTER — Telehealth: Payer: BC Managed Care – PPO | Admitting: Internal Medicine

## 2022-04-03 DIAGNOSIS — B009 Herpesviral infection, unspecified: Secondary | ICD-10-CM

## 2022-04-03 DIAGNOSIS — F411 Generalized anxiety disorder: Secondary | ICD-10-CM

## 2022-04-03 MED ORDER — VALACYCLOVIR HCL 1 G PO TABS: ORAL_TABLET | ORAL | 1 refills | Status: DC

## 2022-04-03 MED ORDER — CLONAZEPAM 1 MG PO TABS: ORAL_TABLET | ORAL | 0 refills | Status: DC

## 2022-04-03 NOTE — Assessment & Plan Note (Signed)
Valacyclovir refilled today 

## 2022-04-03 NOTE — Assessment & Plan Note (Signed)
Clonazepam refilled today

## 2022-04-03 NOTE — Patient Instructions (Signed)

## 2022-04-03 NOTE — Progress Notes (Signed)
Virtual Visit via Video Note  I connected with Hannah Mason on 04/03/22 at  4:00 PM EST by a video enabled telemedicine application and verified that I am speaking with the correct person using two identifiers.  Location: Patient: At the bank Provider: Office  Persons participating in this video call: Webb Silversmith, NP and Antonieta Loveless   I discussed the limitations of evaluation and management by telemedicine and the availability of in person appointments. The patient expressed understanding and agreed to proceed.  History of Present Illness:  Patient due for follow-up of chronic conditions.  Anxiety: Intermittent.  She has been out of her Clonazepam and would like a refill of this today.  She is not currently seeing a therapist.  She denies depression, SI/HI.   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  She would like a refill of this today.   Past Medical History:  Diagnosis Date   ADHD    Allergy    seasonal   Anemia    vitamin d deficiency   Anxiety    COVID-19    Fever blister    Headache(784.0)    frequent   Scoliosis     Current Outpatient Medications  Medication Sig Dispense Refill   clonazePAM (KLONOPIN) 1 MG tablet TAKE 1 TABLET(1 MG) BY MOUTH DAILY AS NEEDED FOR ANXIETY 20 tablet 0   valACYclovir (VALTREX) 1000 MG tablet TAKE 1 TABLET(1000 MG) BY MOUTH DAILY AS NEEDED FOR OUTBREAK 30 tablet 1   No current facility-administered medications for this visit.    No Known Allergies  Family History  Problem Relation Age of Onset   Diabetes Father    Hypertension Father    Dementia Mother    Breast cancer Neg Hx    Ovarian cancer Neg Hx    Colon cancer Neg Hx    Heart disease Neg Hx     Social History   Socioeconomic History   Marital status: Married    Spouse name: Brad   Number of children: 4   Years of education: Not on file   Highest education level: Not on file  Occupational History   Occupation: Control and instrumentation engineer   Tobacco Use   Smoking status: Former    Types: Cigarettes    Quit date: 12/17/2005    Years since quitting: 16.3   Smokeless tobacco: Never  Vaping Use   Vaping Use: Never used  Substance and Sexual Activity   Alcohol use: Yes    Comment: occass   Drug use: No   Sexual activity: Yes    Comment: iud to be removed 02/14/19  Other Topics Concern   Not on file  Social History Narrative   Lives with husband in [place], 4 children in home (14,12,5,22month). No pets. Work AWater quality scientist   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   Social Determinants of Health   Financial Resource Strain: Not on file  Food Insecurity: Not on file  Transportation Needs: Not on file  Physical Activity: Sufficiently Active (12/24/2016)   Exercise Vital Sign    Days of Exercise per Week: 6 days    Minutes of Exercise per Session: 60 min  Stress: No Stress Concern Present (12/24/2016)   [place]   Feeling of Stress : Not at all  Social Connections: Not on file  Intimate Partner Violence: Not At Risk (12/24/2016)   Humiliation, Afraid, Rape, and Kick questionnaire    Fear of Current or Ex-Partner: No  Emotionally Abused: No    Physically Abused: No    Sexually Abused: No     Constitutional: Denies fever, malaise, fatigue, headache or abrupt weight changes.  HEENT: Denies eye pain, eye redness, ear pain, ringing in the ears, wax buildup, runny nose, nasal congestion, bloody nose, or sore throat. Respiratory: Denies difficulty breathing, shortness of breath, cough or sputum production.   Cardiovascular: Denies chest pain, chest tightness, palpitations or swelling in the hands or feet.  Gastrointestinal: Denies abdominal pain, bloating, constipation, diarrhea or blood in the stool.  GU: Denies urgency, frequency, pain with urination, burning sensation, blood in urine, odor or discharge. Musculoskeletal: Denies decrease in range of motion, difficulty  with gait, muscle pain or joint pain and swelling.  Skin: Denies redness, rashes, lesions or ulcercations.  Neurological: Denies dizziness, difficulty with memory, difficulty with speech or problems with balance and coordination.  Psych: Patient has a history of anxiety.  Denies depression, SI/HI.  No other specific complaints in a complete review of systems (except as listed in HPI above).  Observations/Objective:  Wt Readings from Last 3 Encounters:  04/19/20 135 lb 6.4 oz (61.4 kg)  10/11/19 132 lb (59.9 kg)  02/23/19 136 lb 11.2 oz (62 kg)    General: Appears her stated age, well developed, well nourished in NAD. Pulmonary/Chest: Normal effort. No respiratory distress.  Neurological: Alert and oriented.  Psychiatric: Mood and affect normal. Behavior is normal. Judgment and thought content normal.    BMET    Component Value Date/Time   NA 142 04/30/2020 0828   NA 139 11/15/2011 0748   K 4.2 04/30/2020 0828   K 3.7 11/15/2011 0748   CL 107 (H) 04/30/2020 0828   CL 107 11/15/2011 0748   CO2 20 04/30/2020 0828   CO2 24 11/15/2011 0748   GLUCOSE 89 04/30/2020 0828   GLUCOSE 89 02/10/2019 0944   GLUCOSE 103 (H) 11/15/2011 0748   BUN 11 04/30/2020 0828   BUN 10 11/15/2011 0748   CREATININE 0.87 04/30/2020 0828   CREATININE 0.55 (L) 11/15/2011 0748   CALCIUM 8.9 04/30/2020 0828   CALCIUM 9.1 11/15/2011 0748   GFRNONAA >60 02/10/2019 0944   GFRNONAA >60 11/15/2011 0748   GFRAA >60 02/10/2019 0944   GFRAA >60 11/15/2011 0748    Lipid Panel     Component Value Date/Time   CHOL 107 04/30/2020 0828   TRIG 46 04/30/2020 0828   HDL 58 04/30/2020 0828   CHOLHDL 1.8 04/30/2020 0828   CHOLHDL 2 01/20/2011 1444   VLDL 11.6 01/20/2011 1444   LDLCALC 37 04/30/2020 0828    CBC    Component Value Date/Time   WBC 5.9 04/30/2020 0828   WBC 7.0 02/10/2019 0944   RBC 4.18 04/30/2020 0828   RBC 4.67 02/10/2019 0944   HGB 12.6 04/30/2020 0828   HCT 38.3 04/30/2020 0828    PLT 249 04/30/2020 0828   MCV 92 04/30/2020 0828   MCV 87 11/15/2011 0748   MCH 30.1 04/30/2020 0828   MCH 29.8 02/10/2019 0944   MCHC 32.9 04/30/2020 0828   MCHC 32.5 02/10/2019 0944   RDW 13.1 04/30/2020 0828   RDW 14.4 11/15/2011 0748   LYMPHSABS 0.5 (L) 11/15/2011 0748   MONOABS 1.3 (H) 11/15/2011 0748   EOSABS 0.0 11/15/2011 0748   BASOSABS 0.1 11/15/2011 0748    Hgb A1C Lab Results  Component Value Date   HGBA1C 5.2 06/18/2016        Assessment and Plan:  RTC in 6  months for annual exam  Follow Up Instructions:    I discussed the assessment and treatment plan with the patient. The patient was provided an opportunity to ask questions and all were answered. The patient agreed with the plan and demonstrated an understanding of the instructions.   The patient was advised to call back or seek an in-person evaluation if the symptoms worsen or if the condition fails to improve as anticipated.    Webb Silversmith, NP

## 2022-04-05 ENCOUNTER — Encounter (HOSPITAL_BASED_OUTPATIENT_CLINIC_OR_DEPARTMENT_OTHER): Payer: Self-pay | Admitting: Emergency Medicine

## 2022-04-05 ENCOUNTER — Emergency Department (HOSPITAL_BASED_OUTPATIENT_CLINIC_OR_DEPARTMENT_OTHER)
Admission: EM | Admit: 2022-04-05 | Discharge: 2022-04-05 | Disposition: A | Discharge status: Home / Self Care | Payer: BC Managed Care – PPO | Attending: Emergency Medicine | Admitting: Emergency Medicine

## 2022-04-05 ENCOUNTER — Emergency Department (HOSPITAL_BASED_OUTPATIENT_CLINIC_OR_DEPARTMENT_OTHER): Payer: BC Managed Care – PPO

## 2022-04-05 DIAGNOSIS — R42 Dizziness and giddiness: Secondary | ICD-10-CM | POA: Diagnosis not present

## 2022-04-05 DIAGNOSIS — Z8616 Personal history of COVID-19: Secondary | ICD-10-CM | POA: Diagnosis not present

## 2022-04-05 DIAGNOSIS — R110 Nausea: Secondary | ICD-10-CM | POA: Diagnosis not present

## 2022-04-05 DIAGNOSIS — R112 Nausea with vomiting, unspecified: Secondary | ICD-10-CM | POA: Diagnosis not present

## 2022-04-05 DIAGNOSIS — R519 Headache, unspecified: Secondary | ICD-10-CM | POA: Diagnosis not present

## 2022-04-05 LAB — CBC WITH DIFFERENTIAL/PLATELET
Abs Immature Granulocytes: 0.05 10*3/uL (ref 0.00–0.07)
Basophils Absolute: 0.1 10*3/uL (ref 0.0–0.1)
Basophils Relative: 0 %
Eosinophils Absolute: 0 10*3/uL (ref 0.0–0.5)
Eosinophils Relative: 0 %
HCT: 38.2 % (ref 36.0–46.0)
Hemoglobin: 12.5 g/dL (ref 12.0–15.0)
Immature Granulocytes: 0 %
Lymphocytes Relative: 6 %
Lymphs Abs: 0.7 10*3/uL (ref 0.7–4.0)
MCH: 30 pg (ref 26.0–34.0)
MCHC: 32.7 g/dL (ref 30.0–36.0)
MCV: 91.8 fL (ref 80.0–100.0)
Monocytes Absolute: 1 10*3/uL (ref 0.1–1.0)
Monocytes Relative: 9 %
Neutro Abs: 10.2 10*3/uL — ABNORMAL HIGH (ref 1.7–7.7)
Neutrophils Relative %: 85 %
Platelets: 239 10*3/uL (ref 150–400)
RBC: 4.16 MIL/uL (ref 3.87–5.11)
RDW: 13.2 % (ref 11.5–15.5)
WBC: 12 10*3/uL — ABNORMAL HIGH (ref 4.0–10.5)
nRBC: 0 % (ref 0.0–0.2)

## 2022-04-05 LAB — COMPREHENSIVE METABOLIC PANEL
ALT: 18 U/L (ref 0–44)
AST: 18 U/L (ref 15–41)
Albumin: 3.8 g/dL (ref 3.5–5.0)
Alkaline Phosphatase: 44 U/L (ref 38–126)
Anion gap: 6 (ref 5–15)
BUN: 26 mg/dL — ABNORMAL HIGH (ref 6–20)
CO2: 22 mmol/L (ref 22–32)
Calcium: 8.5 mg/dL — ABNORMAL LOW (ref 8.9–10.3)
Chloride: 107 mmol/L (ref 98–111)
Creatinine, Ser: 0.79 mg/dL (ref 0.44–1.00)
GFR, Estimated: >60 mL/min (ref >60)
Glucose, Bld: 103 mg/dL — ABNORMAL HIGH (ref 70–99)
Potassium: 3.7 mmol/L (ref 3.5–5.1)
Sodium: 135 mmol/L (ref 135–145)
Total Bilirubin: 0.6 mg/dL (ref 0.3–1.2)
Total Protein: 6.9 g/dL (ref 6.5–8.1)

## 2022-04-05 LAB — CK: Total CK: 52 U/L (ref 38–234)

## 2022-04-05 MED ORDER — ONDANSETRON 4 MG PO TBDP: 4.0000 mg | ORAL_TABLET | Freq: Three times a day (TID) | ORAL | 0 refills | Status: DC | PRN

## 2022-04-05 MED ORDER — MECLIZINE HCL 25 MG PO TABS
25.0000 mg | ORAL_TABLET | Freq: Once | ORAL | Status: AC
  Administered 2022-04-05: 25 mg via ORAL
  Filled 2022-04-05: qty 1

## 2022-04-05 MED ORDER — ONDANSETRON HCL 4 MG/2ML IJ SOLN: 4.0000 mg | Freq: Once | INTRAMUSCULAR | Status: DC

## 2022-04-05 MED ORDER — MECLIZINE HCL 12.5 MG PO TABS: 25.0000 mg | ORAL_TABLET | Freq: Three times a day (TID) | ORAL | 0 refills | Status: DC | PRN

## 2022-04-05 MED ORDER — METOCLOPRAMIDE HCL 5 MG/ML IJ SOLN
10.0000 mg | Freq: Once | INTRAMUSCULAR | Status: AC
  Administered 2022-04-05: 10 mg via INTRAVENOUS
  Filled 2022-04-05: qty 2

## 2022-04-05 MED ORDER — SODIUM CHLORIDE 0.9 % IV BOLUS
1000.0000 mL | Freq: Once | INTRAVENOUS | Status: AC
  Administered 2022-04-05: 1000 mL via INTRAVENOUS

## 2022-04-05 NOTE — ED Notes (Signed)
Pt had large brown BM via BSC

## 2022-04-05 NOTE — ED Notes (Signed)
Pt c/o room-spinning sensation; nausea has improved at this time; sts dizziness improves when she closes her eyes

## 2022-04-05 NOTE — ED Notes (Signed)
Pt ambulated in hall w/o difficulty

## 2022-04-05 NOTE — ED Provider Notes (Signed)
[facility] Provider Note   CSN: [id number] Arrival date & time: 04/05/22  1348     History  Chief Complaint  Patient presents with   Nausea   Hypotension    Hannah Mason is a 47 y.o. female.  HPI   47 year old female presents emergency department with complaints of dizziness, nausea, vomiting.  Patient states that she ran 7 miles earlier today and felt completely normal afterwards.  States that she ran 6 miles last week and running this distance is not abnormal for her.  States that 2 hours after finishing run, noted acute onset dizzy type sensation described as room spinning and subsequent nausea and vomiting.  States that dizzy feeling is worsened with positional changes of her head and relieved with laying on her left side.  Denies fever, chest pain, shortness of breath, hematemesis, ringing in ears, abdominal pain.  Patient denies history of similar symptoms in the past.  Was reported to have blood pressure of 80 systolic by EMS.  Denies visual disturbance, gait abnormalities, weakness/sensory deficits in upper or lower extremities, slurred speech, facial droop.  Past medical history ADHD, allergy, COVID, generalized anxiety  Home Medications Prior to Admission medications   Medication Sig Start Date End Date Taking? Authorizing Provider  meclizine (ANTIVERT) 12.5 MG tablet Take 2 tablets (25 mg total) by mouth 3 (three) times daily as needed for dizziness. 04/05/22  Yes Dion Saucier A, PA  ondansetron (ZOFRAN-ODT) 4 MG disintegrating tablet Take 1 tablet (4 mg total) by mouth every 8 (eight) hours as needed for nausea or vomiting. 04/05/22  Yes Dion Saucier A, PA  clonazePAM (KLONOPIN) 1 MG tablet TAKE 1 TABLET(1 MG) BY MOUTH DAILY AS NEEDED FOR ANXIETY 04/03/22   Jearld Fenton, NP  valACYclovir (VALTREX) 1000 MG tablet TAKE 1 TABLET(1000 MG) BY MOUTH DAILY AS NEEDED FOR OUTBREAK 04/03/22   Jearld Fenton, NP      Allergies     Patient has no known allergies.    Review of Systems   Review of Systems  All other systems reviewed and are negative.   Physical Exam Updated Vital Signs BP 116/82   Pulse (!) 59   Temp (!) 97.4 F (36.3 C) (Oral)   Resp (!) 24   Ht 5' 7"$  (1.702 m)   Wt 59 kg   SpO2 96%   BMI 20.36 kg/m  Physical Exam Vitals and nursing note reviewed.  Constitutional:      General: She is not in acute distress.    Appearance: She is well-developed.  HENT:     Head: Normocephalic and atraumatic.  Eyes:     Conjunctiva/sclera: Conjunctivae normal.  Cardiovascular:     Rate and Rhythm: Normal rate and regular rhythm.     Heart sounds: No murmur heard. Pulmonary:     Effort: Pulmonary effort is normal. No respiratory distress.     Breath sounds: Normal breath sounds.  Abdominal:     Palpations: Abdomen is soft.     Tenderness: There is no abdominal tenderness.  Musculoskeletal:        General: No swelling.     Cervical back: Neck supple.  Skin:    General: Skin is warm and dry.     Capillary Refill: Capillary refill takes less than 2 seconds.  Neurological:     Mental Status: She is alert.     Comments: Alert and oriented to self, place, time and event.   Speech is  fluent, clear without dysarthria or dysphasia.   Strength 5/5 in upper/lower extremities   Sensation intact in upper/lower extremities   Normal gait.  Negative Romberg. No pronator drift.  Normal finger-to-nose and feet tapping.  CN I not tested  CN II not tested CN III, IV, VI PERRLA and EOMs intact bilaterally horizontal rightward beating nystagmus on EOMs. CN V Intact sensation to sharp and light touch to the face  CN VII facial movements symmetric  CN VIII not tested  CN IX, X no uvula deviation, symmetric rise of soft palate  CN XI 5/5 SCM and trapezius strength bilaterally  CN XII Midline tongue protrusion, symmetric L/R movements    Psychiatric:        Mood and Affect: Mood normal.    ED Results /  Procedures / Treatments   Labs (all labs ordered are listed, but only abnormal results are displayed) Labs Reviewed  COMPREHENSIVE METABOLIC PANEL - Abnormal; Notable for the following components:      Result Value   Glucose, Bld 103 (*)    BUN 26 (*)    Calcium 8.5 (*)    All other components within normal limits  CBC WITH DIFFERENTIAL/PLATELET - Abnormal; Notable for the following components:   WBC 12.0 (*)    Neutro Abs 10.2 (*)    All other components within normal limits  CK  URINALYSIS, ROUTINE W REFLEX MICROSCOPIC  PREGNANCY, URINE    EKG None  Radiology CT Head Wo Contrast  Result Date: 04/05/2022 CLINICAL DATA:  Nausea, dizziness, vomiting, headache EXAM: CT HEAD WITHOUT CONTRAST TECHNIQUE: Contiguous axial images were obtained from the base of the skull through the vertex without intravenous contrast. RADIATION DOSE REDUCTION: This exam was performed according to the departmental dose-optimization program which includes automated exposure control, adjustment of the mA and/or kV according to patient size and/or use of iterative reconstruction technique. COMPARISON:  None Available. FINDINGS: Brain: No acute infarct or hemorrhage. Lateral ventricles and midline structures are unremarkable. No acute extra-axial fluid collections. No mass effect. Vascular: No hyperdense vessel or unexpected calcification. Skull: Normal. Negative for fracture or focal lesion. Sinuses/Orbits: No acute finding. Other: None. IMPRESSION: 1. No acute intracranial process. Electronically Signed   By: Randa Ngo M.D.   On: 04/05/2022 16:32    Procedures Procedures    Medications Ordered in ED Medications  sodium chloride 0.9 % bolus 1,000 mL ( Intravenous Stopped 04/05/22 1546)  metoCLOPramide (REGLAN) injection 10 mg (10 mg Intravenous Given 04/05/22 1456)  meclizine (ANTIVERT) tablet 25 mg (25 mg Oral Given 04/05/22 1553)    ED Course/ Medical Decision Making/ A&P Clinical Course as of  04/05/22 1843  Sat Apr 05, 2022  1544 Reevaluation of the patient showed significant improvement of dizziness.  Patient still experience some symptoms with movement of her head.  To trial meclizine with reassessment. [CR]  1716 Reevaluation of the patient showed significant improvement of patient's dizziness.  She noted near resolution of symptoms.  To ambulate and reassess with expectant discharge. [CR]    Clinical Course User Index [CR] Wilnette Kales, PA                             Medical Decision Making Amount and/or Complexity of Data Reviewed Labs: ordered. Radiology: ordered.  Risk Prescription drug management.   This patient presents to the ED for concern of dizziness/vomiting, this involves an extensive number of treatment options, and is a complaint  that carries with it a high risk of complications and morbidity.  The differential diagnosis includes BPPV, Mnire's disease, labyrinthitis, malignancy, CVA, electrolyte abnormalities   Co morbidities that complicate the patient evaluation  See HPI   Additional history obtained:  Additional history obtained from EMR External records from outside source obtained and reviewed including hospital records   Lab Tests:  I Ordered, and personally interpreted labs.  The pertinent results include: Leukocytosis of 12.0.  No evidence of anemia.  Placed in normal range.  No electrolyte abnormalities appreciated.  No renal dysfunction no transaminitis.  CK within normal limits.   Imaging Studies ordered:  I ordered imaging studies including CT head I independently visualized and interpreted imaging which showed no acute intracranial abnormalities I agree with the radiologist interpretation   Cardiac Monitoring: / EKG:  The patient was maintained on a cardiac monitor.  I personally viewed and interpreted the cardiac monitored which showed an underlying rhythm of: Sinus rhythm without acute ischemic  changes   Consultations Obtained:  N/a   Problem List / ED Course / Critical interventions / Medication management  Dizziness/vomiting I ordered medication including 1 L normal saline, Reglan, Antivert   Reevaluation of the patient after these medicines showed that the patient improved I have reviewed the patients home medicines and have made adjustments as needed   Social Determinants of Health:  Former cigarette use.  Denies illicit drug use.   Test / Admission - Considered:  Dizziness/vomiting Vitals signs within normal range and stable throughout visit. Laboratory/imaging studies significant for: See above Patient presents with feelings of dizziness most consistent with peripheral cause of vertigo.  Resolution of symptoms with administration of Reglan and subsequent Antivert.  Patient's neurologic exam entirely benign with indication towards peripheral vertigo given worsening of symptoms with positional changes of the head and horizontal nystagmus appreciated.  Patient recommended proper oral hydration be electrolyte rich fluids, given Zofran as needed for nausea/vomiting as well as given Antivert to take for feelings of dizziness.  ENT information given for close follow-up.  Treatment plan discussed at length with patient and she acknowledged understanding was agreeable to said plan.  Further workup deemed unnecessary at this time in the emergency department. Worrisome signs and symptoms were discussed with the patient, and the patient acknowledged understanding to return to the ED if noticed. Patient was stable upon discharge.          Final Clinical Impression(s) / ED Diagnoses Final diagnoses:  Dizziness  Nausea and vomiting, unspecified vomiting type    Rx / DC Orders ED Discharge Orders          Ordered    meclizine (ANTIVERT) 12.5 MG tablet  3 times daily PRN        04/05/22 1737    ondansetron (ZOFRAN-ODT) 4 MG disintegrating tablet  Every 8 hours PRN         04/05/22 1737              Wilnette Kales, [place] 04/05/22 1843    Gareth Morgan, MD 04/06/22 [phone]

## 2022-04-05 NOTE — Discharge Instructions (Addendum)
Note the symptoms of dizziness most likely secondary to vertigo as we talked about.  Will send in medicine that we gave you on the emergency department called Antivert to take as needed for feelings of dizziness.  Will also send in medicine for nausea called Zofran to take for nausea.  Recommend follow-up with ENT for reevaluation of your symptoms.  Please do not hesitate to return to emergency department for worrisome signs and symptoms we discussed become apparent.

## 2022-04-05 NOTE — ED Triage Notes (Signed)
[place] [facility]-  Pt c/o nausea after running 7 mi after eating lunch.  Reports dizziness, nausea.   EMS reports initial BP 99991111 systolic palpated. Emesis x1. HR 60 CBG 126, AOx4.

## 2022-04-05 NOTE — ED Notes (Signed)
Sprite provided for po challenge

## 2022-04-28 DIAGNOSIS — Z713 Dietary counseling and surveillance: Secondary | ICD-10-CM | POA: Diagnosis not present

## 2022-05-27 DIAGNOSIS — M62830 Muscle spasm of back: Secondary | ICD-10-CM | POA: Diagnosis not present

## 2022-05-27 DIAGNOSIS — M9901 Segmental and somatic dysfunction of cervical region: Secondary | ICD-10-CM | POA: Diagnosis not present

## 2022-05-27 DIAGNOSIS — M9902 Segmental and somatic dysfunction of thoracic region: Secondary | ICD-10-CM | POA: Diagnosis not present

## 2022-05-27 DIAGNOSIS — M542 Cervicalgia: Secondary | ICD-10-CM | POA: Diagnosis not present

## 2022-06-25 DIAGNOSIS — M9901 Segmental and somatic dysfunction of cervical region: Secondary | ICD-10-CM | POA: Diagnosis not present

## 2022-06-25 DIAGNOSIS — M9902 Segmental and somatic dysfunction of thoracic region: Secondary | ICD-10-CM | POA: Diagnosis not present

## 2022-06-25 DIAGNOSIS — M542 Cervicalgia: Secondary | ICD-10-CM | POA: Diagnosis not present

## 2022-06-25 DIAGNOSIS — M62830 Muscle spasm of back: Secondary | ICD-10-CM | POA: Diagnosis not present

## 2022-08-01 NOTE — Patient Instructions (Signed)
Preventive Care 40-46 Years Old, Female Preventive care refers to lifestyle choices and visits with your health care provider that can promote health and wellness. Preventive care visits are also called wellness exams. What can I expect for my preventive care visit? Counseling Your health care provider may ask you questions about your: Medical history, including: Past medical problems. Family medical history. Pregnancy history. Current health, including: Menstrual cycle. Method of birth control. Emotional well-being. Home life and relationship well-being. Sexual activity and sexual health. Lifestyle, including: Alcohol, nicotine or tobacco, and drug use. Access to firearms. Diet, exercise, and sleep habits. Work and work environment. Sunscreen use. Safety issues such as seatbelt and bike helmet use. Physical exam Your health care provider will check your: Height and weight. These may be used to calculate your BMI (body mass index). BMI is a measurement that tells if you are at a healthy weight. Waist circumference. This measures the distance around your waistline. This measurement also tells if you are at a healthy weight and may help predict your risk of certain diseases, such as type 2 diabetes and high blood pressure. Heart rate and blood pressure. Body temperature. Skin for abnormal spots. What immunizations do I need?  Vaccines are usually given at various ages, according to a schedule. Your health care provider will recommend vaccines for you based on your age, medical history, and lifestyle or other factors, such as travel or where you work. What tests do I need? Screening Your health care provider may recommend screening tests for certain conditions. This may include: Lipid and cholesterol levels. Diabetes screening. This is done by checking your blood sugar (glucose) after you have not eaten for a while (fasting). Pelvic exam and Pap test. Hepatitis B test. Hepatitis C  test. HIV (human immunodeficiency virus) test. STI (sexually transmitted infection) testing, if you are at risk. Lung cancer screening. Colorectal cancer screening. Mammogram. Talk with your health care provider about when you should start having regular mammograms. This may depend on whether you have a family history of breast cancer. BRCA-related cancer screening. This may be done if you have a family history of breast, ovarian, tubal, or peritoneal cancers. Bone density scan. This is done to screen for osteoporosis. Talk with your health care provider about your test results, treatment options, and if necessary, the need for more tests. Follow these instructions at home: Eating and drinking  Eat a diet that includes fresh fruits and vegetables, whole grains, lean protein, and low-fat dairy products. Take vitamin and mineral supplements as recommended by your health care provider. Do not drink alcohol if: Your health care provider tells you not to drink. You are pregnant, may be pregnant, or are planning to become pregnant. If you drink alcohol: Limit how much you have to 0-1 drink a day. Know how much alcohol is in your drink. In the [place] one drink equals one 12 oz bottle of beer (355 mL), one 5 oz glass of wine (148 mL), or one 1 oz glass of hard liquor (44 mL). Lifestyle Brush your teeth every morning and night with fluoride toothpaste. Floss one time each day. Exercise for at least 30 minutes 5 or more days each week. Do not use any products that contain nicotine or tobacco. These products include cigarettes, chewing tobacco, and vaping devices, such as e-cigarettes. If you need help quitting, ask your health care provider. Do not use drugs. If you are sexually active, practice safe sex. Use a condom or other form of protection to   prevent STIs. If you do not wish to become pregnant, use a form of birth control. If you plan to become pregnant, see your health care provider for a  prepregnancy visit. Take aspirin only as told by your health care provider. Make sure that you understand how much to take and what form to take. Work with your health care provider to find out whether it is safe and beneficial for you to take aspirin daily. Find healthy ways to manage stress, such as: Meditation, yoga, or listening to music. Journaling. Talking to a trusted person. Spending time with friends and family. Minimize exposure to UV radiation to reduce your risk of skin cancer. Safety Always wear your seat belt while driving or riding in a vehicle. Do not drive: If you have been drinking alcohol. Do not ride with someone who has been drinking. When you are tired or distracted. While texting. If you have been using any mind-altering substances or drugs. Wear a helmet and other protective equipment during sports activities. If you have firearms in your house, make sure you follow all gun safety procedures. Seek help if you have been physically or sexually abused. What's next? Visit your health care provider once a year for an annual wellness visit. Ask your health care provider how often you should have your eyes and teeth checked. Stay up to date on all vaccines. This information is not intended to replace advice given to you by your health care provider. Make sure you discuss any questions you have with your health care provider. Document Revised: 08/01/2020 Document Reviewed: 08/01/2020 Elsevier Patient Education  2024 [employer]. Breast Self-Awareness Breast self-awareness is knowing how your breasts look and feel. You need to: Check your breasts on a regular basis. Tell your doctor about any changes. Become familiar with the look and feel of your breasts. This can help you catch a breast problem while it is still small and can be treated. You should do breast self-exams even if you have breast implants. What you need: A mirror. A well-lit room. A pillow or other  soft object. How to do a breast self-exam Follow these steps to do a breast self-exam: Look for changes  Take off all the clothes above your waist. Stand in front of a mirror in a room with good lighting. Put your hands down at your sides. Compare your breasts in the mirror. Look for any difference between them, such as: A difference in shape. A difference in size. Wrinkles, dips, and bumps in one breast and not the other. Look at each breast for changes in the skin, such as: Redness. Scaly areas. Skin that has gotten thicker. Dimpling. Open sores (ulcers). Look for changes in your nipples, such as: Fluid coming out of a nipple. Fluid around a nipple. Bleeding. Dimpling. Redness. A nipple that looks pushed in (retracted), or that has changed position. Feel for changes Lie on your back. Feel each breast. To do this: Pick a breast to feel. Place a pillow under the shoulder closest to that breast. Put the arm closest to that breast behind your head. Feel the nipple area of that breast using the hand of your other arm. Feel the area with the pads of your three middle fingers by making small circles with your fingers. Use light, medium, and firm pressure. Continue the overlapping circles, moving downward over the breast. Keep making circles with your fingers. Stop when you feel your ribs. Start making circles with your fingers again, this time going   upward until you reach your collarbone. Then, make circles outward across your breast and into your armpit area. Squeeze your nipple. Check for discharge and lumps. Repeat these steps to check your other breast. Sit or stand in the tub or shower. With soapy water on your skin, feel each breast the same way you did when you were lying down. Write down what you find Writing down what you find can help you remember what to tell your doctor. Write down: What is normal for each breast. Any changes you find in each breast. These  include: The kind of changes you find. A tender or painful breast. Any lump you find. Write down its size and where it is. When you last had your monthly period (menstrual cycle). General tips If you are breastfeeding, the best time to check your breasts is after you feed your baby or after you use a breast pump. If you get monthly bleeding, the best time to check your breasts is 5-7 days after your monthly cycle ends. With time, you will become comfortable with the self-exam. You will also start to know if there are changes in your breasts. Contact a doctor if: You see a change in the shape or size of your breasts or nipples. You see a change in the skin of your breast or nipples, such as red or scaly skin. You have fluid coming from your nipples that is not normal. You find a new lump or thick area. You have breast pain. You have any concerns about your breast health. Summary Breast self-awareness includes looking for changes in your breasts and feeling for changes within your breasts. You should do breast self-awareness in front of a mirror in a well-lit room. If you get monthly periods (menstrual cycles), the best time to check your breasts is 5-7 days after your period ends. Tell your doctor about any changes you see in your breasts. Changes include changes in size, changes on the skin, painful or tender breasts, or fluid from your nipples that is not normal. This information is not intended to replace advice given to you by your health care provider. Make sure you discuss any questions you have with your health care provider. Document Revised: 07/11/2021 Document Reviewed: 12/06/2020 Elsevier Patient Education  2024 [employer] Inc.  

## 2022-08-01 NOTE — Progress Notes (Signed)
GYNECOLOGY ANNUAL PHYSICAL EXAM PROGRESS NOTE  Subjective:    Hannah Mason is a 47 y.o. G63P2012 female who presents for an annual exam. The patient is sexually active. The patient participates in regular exercise: yes. Has the patient ever been transfused or tattooed?: no. The patient reports that there is not domestic violence in her life.   The patient has the following complaints today:  She has a bump on the inside of her vagina that has her concerned. Has been present for slightly over a week. Notes that it is different from the bump she has had on her labia for the past several years (although feels like that one may also be getting bigger).  Notes that she called telehealth who told her it could be a skin infection (such as MRSA) or folliculitis due to recent shaving.  Was prescribed an antibiotic but has not started as she wanted to be examined first.    Menstrual History: Menarche age: 55 No LMP recorded. Patient has had an ablation.    Gynecologic History:  Contraception: tubal ligation History of STI's: Denies Last Pap: 03/09/2018. Results were: normal.  Denies h/o abnormal pap smears. Last mammogram: 04/30/2021. Results were: normal   OB History  Gravida Para Term Preterm AB Living  3 2 2  0 1 2  SAB IAB Ectopic Multiple Live Births  1 0 0 0 2    # Outcome Date GA Lbr Len/2nd Weight Sex Delivery Anes PTL Lv  3 Term 2012   6 lb 14.4 oz (3.13 kg) F CS-LTranv   LIV  2 SAB 2010          1 Term 2007   6 lb 2.2 oz (2.785 kg) F CS-LTranv   LIV    Past Medical History:  Diagnosis Date   ADHD    Allergy    seasonal   Anemia    vitamin d deficiency   Anxiety    COVID-19    Fever blister    Headache(784.0)    frequent   Scoliosis     Past Surgical History:  Procedure Laterality Date   APPENDECTOMY     CESAREAN SECTION     1   DILATATION AND CURETTAGE/HYSTEROSCOPY WITH MINERVA N/A 02/14/2019   Procedure: DILATATION AND CURETTAGE/HYSTEROSCOPY WITH  MINERVA;  Surgeon: Hildred Laser, MD;  Location: [facility];  Service: Gynecology;  Laterality: N/A;   DILATION AND CURETTAGE OF UTERUS     DNC  2011   Miscarrigage in 2011   IUD REMOVAL N/A 02/14/2019   Procedure: INTRAUTERINE DEVICE (IUD) REMOVAL;  Surgeon: Hildred Laser, MD;  Location: [facility];  Service: Gynecology;  Laterality: N/A;   TUBAL LIGATION     TUBAL LIGATION      Family History  Problem Relation Age of Onset   Diabetes Father    Hypertension Father    Dementia Mother    Breast cancer Neg Hx    Ovarian cancer Neg Hx    Colon cancer Neg Hx    Heart disease Neg Hx     Social History   Socioeconomic History   Marital status: Married    Spouse name: Brad   Number of children: 4   Years of education: Not on file   Highest education level: Not on file  Occupational History   Occupation: Geologist, engineering  Tobacco Use   Smoking status: Former    Types: Cigarettes    Quit date: 12/17/2005    Years since quitting: 16.6  Smokeless tobacco: Never  Vaping Use   Vaping Use: Never used  Substance and Sexual Activity   Alcohol use: Yes    Comment: occass   Drug use: No   Sexual activity: Yes    Comment: iud to be removed 02/14/19  Other Topics Concern   Not on file  Social History Narrative   Lives with husband in [place], 4 children in home (14,12,5,88months). 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   Social Determinants of Health   Financial Resource Strain: Not on file  Food Insecurity: Not on file  Transportation Needs: Not on file  Physical Activity: Sufficiently Active (12/24/2016)   Exercise Vital Sign    Days of Exercise per Week: 6 days    Minutes of Exercise per Session: 60 min  Stress: No Stress Concern Present (12/24/2016)   [employer] of Occupational Health - Occupational Stress Questionnaire    Feeling of Stress : Not at all  Social Connections: Not on file  Intimate Partner Violence: Not At Risk (12/24/2016)   Humiliation,  Afraid, Rape, and Kick questionnaire    Fear of Current or Ex-Partner: No    Emotionally Abused: No    Physically Abused: No    Sexually Abused: No    Current Outpatient Medications on File Prior to Visit  Medication Sig Dispense Refill   clonazePAM (KLONOPIN) 1 MG tablet TAKE 1 TABLET(1 MG) BY MOUTH DAILY AS NEEDED FOR ANXIETY 20 tablet 0   meclizine (ANTIVERT) 12.5 MG tablet Take 2 tablets (25 mg total) by mouth 3 (three) times daily as needed for dizziness. 30 tablet 0   ondansetron (ZOFRAN-ODT) 4 MG disintegrating tablet Take 1 tablet (4 mg total) by mouth every 8 (eight) hours as needed for nausea or vomiting. 20 tablet 0   valACYclovir (VALTREX) 1000 MG tablet TAKE 1 TABLET(1000 MG) BY MOUTH DAILY AS NEEDED FOR OUTBREAK 30 tablet 1   No current facility-administered medications on file prior to visit.    No Known Allergies   Review of Systems Constitutional: negative for chills, fatigue, fevers and sweats Eyes: negative for irritation, redness and visual disturbance Ears, nose, mouth, throat, and face: negative for hearing loss, nasal congestion, snoring and tinnitus Respiratory: negative for asthma, cough, sputum Cardiovascular: negative for chest pain, dyspnea, exertional chest pressure/discomfort, irregular heart beat, palpitations and syncope Gastrointestinal: negative for abdominal pain, change in bowel habits, nausea and vomiting Genitourinary: negative for abnormal menstrual periods, sexual problems and vaginal discharge, dysuria and urinary incontinence. Positive for vaginal lesions (see HPI) Integument/breast: negative for breast lump, breast tenderness and nipple discharge Hematologic/lymphatic: negative for bleeding and easy bruising Musculoskeletal:negative for back pain and muscle weakness Neurological: negative for dizziness, headaches, vertigo and weakness Endocrine: negative for diabetic symptoms including polydipsia, polyuria and skin  dryness Allergic/Immunologic: negative for hay fever and urticaria      Objective:  Blood pressure 102/66, pulse 79, resp. rate 16, height 5\' 7"  (1.702 m), weight 134 lb 8 oz (61 kg).  Body mass index is 21.07 kg/m.    General Appearance:    Alert, cooperative, no distress, appears stated age  Head:    Normocephalic, without obvious abnormality, atraumatic  Eyes:    PERRL, conjunctiva/corneas clear, EOM's intact, both eyes  Ears:    Normal external ear canals, both ears  Nose:   Nares normal, septum midline, mucosa normal, no drainage or sinus tenderness  Throat:   Lips, mucosa, and tongue normal; teeth and gums normal  Neck:  Supple, symmetrical, trachea midline, no adenopathy; thyroid: no enlargement/tenderness/nodules; no carotid bruit or JVD  Back:     Symmetric, no curvature, ROM normal, no CVA tenderness  Lungs:     Clear to auscultation bilaterally, respirations unlabored  Chest Wall:    No tenderness or deformity   Heart:    Regular rate and rhythm, S1 and S2 normal, no murmur, rub or gallop  Breast Exam:    No tenderness, masses, or nipple abnormality. Bilateral breast implants present.   Abdomen:     Soft, non-tender, bowel sounds active all four quadrants, no masses, no organomegaly.    Genitalia:    Pelvic:external genitalia with 2 small sebaceous cysts on right labia minora, stable, non-tender. Left vulvar/buttock region with a single mildly tender non-erythematous raised lesion and surrounding induration with appearance of boil or folliculitis.  Vagina without lesions, discharge, or tenderness, rectovaginal septum  normal. Cervix normal in appearance, no cervical motion tenderness, no adnexal masses or tenderness.  Uterus normal size, shape, mobile, regular contours, nontender.  Rectal:    Normal external sphincter.  No hemorrhoids appreciated. Internal exam not done.   Extremities:   Extremities normal, atraumatic, no cyanosis or edema  Pulses:   2+ and symmetric all  extremities  Skin:   Skin color, texture, turgor normal, no rashes or lesions  Lymph nodes:   Cervical, supraclavicular, and axillary nodes normal  Neurologic:   CNII-XII intact, normal strength, sensation and reflexes throughout   .  Labs:  Lab Results  Component Value Date   WBC 12.0 (H) 04/05/2022   HGB 12.5 04/05/2022   HCT 38.2 04/05/2022   MCV 91.8 04/05/2022   PLT 239 04/05/2022    Lab Results  Component Value Date   CREATININE 0.79 04/05/2022   BUN 26 (H) 04/05/2022   NA 135 04/05/2022   K 3.7 04/05/2022   CL 107 04/05/2022   CO2 22 04/05/2022    Lab Results  Component Value Date   ALT 18 04/05/2022   AST 18 04/05/2022   ALKPHOS 44 04/05/2022   BILITOT 0.6 04/05/2022    Lab Results  Component Value Date   TSH 2.490 04/30/2020     Assessment:   1. Encounter for well woman exam with routine gynecological exam   2. Cervical cancer screening   3. Encounter for screening mammogram for malignant neoplasm of breast   4. Screening for diabetes mellitus (DM)   5. Screening cholesterol level   6. Boil of vulva      Plan:  Blood tests: Ordered today (Lipid panel, HgbA1c, TSH). Breast self exam technique reviewed and patient encouraged to perform self-exam monthly. Contraception: tubal ligation. Discussed healthy lifestyle modifications. Mammogram ordered Pap smear ordered. Advised on sitz baths, epsom salts, and can go ahead with antibiotic use for boil.  Follow up in 1 year for annual exam   Hildred Laser, MD [facility]

## 2022-08-05 ENCOUNTER — Other Ambulatory Visit (HOSPITAL_COMMUNITY)
Admission: RE | Admit: 2022-08-05 | Discharge: 2022-08-05 | Disposition: A | Discharge status: Home / Self Care | Payer: BC Managed Care – PPO | Source: Ambulatory Visit | Attending: Obstetrics and Gynecology | Admitting: Obstetrics and Gynecology

## 2022-08-05 ENCOUNTER — Ambulatory Visit (INDEPENDENT_AMBULATORY_CARE_PROVIDER_SITE_OTHER): Payer: BC Managed Care – PPO | Admitting: Obstetrics and Gynecology

## 2022-08-05 ENCOUNTER — Encounter: Payer: Self-pay | Admitting: Obstetrics and Gynecology

## 2022-08-05 VITALS — BP 102/66 | HR 79 | Resp 16 | Ht 67.0 in | Wt 134.5 lb

## 2022-08-05 DIAGNOSIS — Z124 Encounter for screening for malignant neoplasm of cervix: Secondary | ICD-10-CM

## 2022-08-05 DIAGNOSIS — Z01419 Encounter for gynecological examination (general) (routine) without abnormal findings: Secondary | ICD-10-CM | POA: Insufficient documentation

## 2022-08-05 DIAGNOSIS — Z13220 Encounter for screening for lipoid disorders: Secondary | ICD-10-CM

## 2022-08-05 DIAGNOSIS — M542 Cervicalgia: Secondary | ICD-10-CM | POA: Diagnosis not present

## 2022-08-05 DIAGNOSIS — M9901 Segmental and somatic dysfunction of cervical region: Secondary | ICD-10-CM | POA: Diagnosis not present

## 2022-08-05 DIAGNOSIS — Z113 Encounter for screening for infections with a predominantly sexual mode of transmission: Secondary | ICD-10-CM | POA: Insufficient documentation

## 2022-08-05 DIAGNOSIS — Z131 Encounter for screening for diabetes mellitus: Secondary | ICD-10-CM

## 2022-08-05 DIAGNOSIS — M62830 Muscle spasm of back: Secondary | ICD-10-CM | POA: Diagnosis not present

## 2022-08-05 DIAGNOSIS — N764 Abscess of vulva: Secondary | ICD-10-CM

## 2022-08-05 DIAGNOSIS — M9902 Segmental and somatic dysfunction of thoracic region: Secondary | ICD-10-CM | POA: Diagnosis not present

## 2022-08-05 DIAGNOSIS — Z1231 Encounter for screening mammogram for malignant neoplasm of breast: Secondary | ICD-10-CM

## 2022-08-06 LAB — LIPID PANEL
Chol/HDL Ratio: 2 ratio (ref 0.0–4.4)
Cholesterol, Total: 122 mg/dL (ref 100–199)
HDL: 61 mg/dL (ref >39)
LDL Chol Calc (NIH): 51 mg/dL (ref 0–99)
Triglycerides: 38 mg/dL (ref 0–149)
VLDL Cholesterol Cal: 10 mg/dL (ref 5–40)

## 2022-08-06 LAB — HEMOGLOBIN A1C
Est. average glucose Bld gHb Est-mCnc: 105 mg/dL
Hgb A1c MFr Bld: 5.3 % (ref 4.8–5.6)

## 2022-08-06 LAB — TSH: TSH: 1.85 u[IU]/mL (ref 0.450–4.500)

## 2022-08-08 LAB — CYTOLOGY - PAP
Comment: NEGATIVE
Diagnosis: NEGATIVE
High risk HPV: NEGATIVE

## 2022-08-25 ENCOUNTER — Telehealth: Payer: Self-pay

## 2022-08-25 NOTE — Telephone Encounter (Signed)
If she feels that it is not getting better, can be scheduled to come to the office and possibly have it drained.

## 2022-08-25 NOTE — Telephone Encounter (Signed)
Scheduled for 09/10/22

## 2022-08-25 NOTE — Telephone Encounter (Signed)
Patient calling in to follow-up on her vaginal bump she was previously seen for. She states still having the bump and it seems to be bigger now and causing discomfort. She reports no success with sitz baths, epsom salts of the antibiotic. Please advise next steps.

## 2022-09-03 ENCOUNTER — Ambulatory Visit: Payer: BC Managed Care – PPO

## 2022-09-10 ENCOUNTER — Ambulatory Visit: Payer: BC Managed Care – PPO | Admitting: Obstetrics and Gynecology

## 2022-09-25 ENCOUNTER — Ambulatory Visit: Payer: BC Managed Care – PPO

## 2022-10-11 ENCOUNTER — Other Ambulatory Visit: Payer: Self-pay | Admitting: Internal Medicine

## 2022-10-14 NOTE — Telephone Encounter (Signed)
Requested medications are due for refill today.  yes  Requested medications are on the active medications list.  yes  Last refill. 04/03/2022 #20 0 rf  Future visit scheduled.   no  Notes to clinic.  Refill/refusal not delegated.    Requested Prescriptions  Pending Prescriptions Disp Refills   clonazePAM (KLONOPIN) 1 MG tablet [Pharmacy Med Name: CLONAZEPAM 1MG  TABLETS] 20 tablet     Sig: TAKE 1 TABLET(1 MG) BY MOUTH DAILY AS NEEDED FOR ANXIETY     Not Delegated - Psychiatry: Anxiolytics/Hypnotics 2 Failed - 10/11/2022  6:55 PM      Failed - This refill cannot be delegated      Failed - Urine Drug Screen completed in last 360 days      Failed - Valid encounter within last 6 months    Recent Outpatient Visits           6 months ago GAD (generalized anxiety disorder)   [facility] [place], [place], [place]              Passed - Patient is not pregnant

## 2022-10-17 ENCOUNTER — Other Ambulatory Visit: Payer: Self-pay | Admitting: Internal Medicine

## 2022-10-17 NOTE — Telephone Encounter (Signed)
Requested Prescriptions  Pending Prescriptions Disp Refills   valACYclovir (VALTREX) 1000 MG tablet [Pharmacy Med Name: VALACYCLOVIR 1GM TABLETS] 30 tablet 1    Sig: TAKE 1 TABLET(1000 MG) BY MOUTH DAILY AS NEEDED FOR OUTBREAK     Antimicrobials:  Antiviral Agents - Anti-Herpetic Passed - 10/17/2022  7:35 AM      Passed - Valid encounter within last 12 months    Recent Outpatient Visits           6 months ago GAD (generalized anxiety disorder)   [facility] [place], [place], [place]

## 2022-10-27 ENCOUNTER — Ambulatory Visit: Payer: BC Managed Care – PPO

## 2023-01-06 ENCOUNTER — Telehealth (INDEPENDENT_AMBULATORY_CARE_PROVIDER_SITE_OTHER): Payer: BC Managed Care – PPO | Admitting: Internal Medicine

## 2023-01-06 ENCOUNTER — Encounter: Payer: Self-pay | Admitting: Internal Medicine

## 2023-01-06 DIAGNOSIS — F900 Attention-deficit hyperactivity disorder, predominantly inattentive type: Secondary | ICD-10-CM

## 2023-01-06 DIAGNOSIS — F411 Generalized anxiety disorder: Secondary | ICD-10-CM

## 2023-01-06 DIAGNOSIS — B009 Herpesviral infection, unspecified: Secondary | ICD-10-CM | POA: Diagnosis not present

## 2023-01-06 NOTE — Progress Notes (Unsigned)
Virtual Visit via Video Note  I connected with Hannah Mason on 01/06/23 at  4:00 PM EST by a video enabled telemedicine application and verified that I am speaking with the correct person using two identifiers.  Location: Patient: Work Provider: Office  Persons participating in this video call: Nicki Reaper, NP and Alden Hipp   I discussed the limitations of evaluation and management by telemedicine and the availability of in person appointments. The patient expressed understanding and agreed to proceed.  History of Present Illness:  Patient due for follow-up of chronic conditions.  Anxiety: Intermittent.  She has been out of her clonazepam and would like a refill of this today.  She is not currently seeing a therapist.  She denies depression, SI/HI.   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  She would like a refill of this today.   Past Medical History:  Diagnosis Date   ADHD    Allergy    seasonal   Anemia    vitamin d deficiency   Anxiety    COVID-19    Fever blister    Headache(784.0)    frequent   Scoliosis     Current Outpatient Medications  Medication Sig Dispense Refill   clonazePAM (KLONOPIN) 1 MG tablet TAKE 1 TABLET(1 MG) BY MOUTH DAILY AS NEEDED FOR ANXIETY 20 tablet 0   mupirocin ointment (BACTROBAN) 2 % Apply 1 Application topically 3 (three) times daily.     valACYclovir (VALTREX) 1000 MG tablet TAKE 1 TABLET(1000 MG) BY MOUTH DAILY AS NEEDED FOR OUTBREAK 30 tablet 1   No current facility-administered medications for this visit.    No Known Allergies  Family History  Problem Relation Age of Onset   Diabetes Father    Hypertension Father    Dementia Mother    Breast cancer Neg Hx    Ovarian cancer Neg Hx    Colon cancer Neg Hx    Heart disease Neg Hx     Social History   Socioeconomic History   Marital status: Married    Spouse name: Brad   Number of children: 4   Years of education: Not on file   Highest  education level: Not on file  Occupational History   Occupation: Geologist, engineering  Tobacco Use   Smoking status: Former    Current packs/day: 0.00    Types: Cigarettes    Quit date: 12/17/2005    Years since quitting: 17.0   Smokeless tobacco: Never  Vaping Use   Vaping status: Never Used  Substance and Sexual Activity   Alcohol use: Yes    Comment: occass   Drug use: No   Sexual activity: Yes    Comment: iud to be removed 02/14/19  Other Topics Concern   Not on file  Social History Narrative   Lives with husband in [place], 4 children in home (14,12,5,31months). 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   Social Determinants of Health   Financial Resource Strain: Not on file  Food Insecurity: Not on file  Transportation Needs: Not on file  Physical Activity: Sufficiently Active (12/24/2016)   Exercise Vital Sign    Days of Exercise per Week: 6 days    Minutes of Exercise per Session: 60 min  Stress: No Stress Concern Present (12/24/2016)   [employer] of Occupational Health - Occupational Stress Questionnaire    Feeling of Stress : Not at all  Social Connections: Not on file  Intimate Partner Violence:  Not At Risk (12/24/2016)   Humiliation, Afraid, Rape, and Kick questionnaire    Fear of Current or Ex-Partner: No    Emotionally Abused: No    Physically Abused: No    Sexually Abused: No     Constitutional: Denies fever, malaise, fatigue, headache or abrupt weight changes.  HEENT: Denies eye pain, eye redness, ear pain, ringing in the ears, wax buildup, runny nose, nasal congestion, bloody nose, or sore throat. Respiratory: Denies difficulty breathing, shortness of breath, cough or sputum production.   Cardiovascular: Denies chest pain, chest tightness, palpitations or swelling in the hands or feet.  Gastrointestinal: Denies abdominal pain, bloating, constipation, diarrhea or blood in the stool.  GU: Denies urgency, frequency, pain with urination,  burning sensation, blood in urine, odor or discharge. Musculoskeletal: Denies decrease in range of motion, difficulty with gait, muscle pain or joint pain and swelling.  Skin: Denies redness, rashes, lesions or ulcercations.  Neurological: Denies dizziness, difficulty with memory, difficulty with speech or problems with balance and coordination.  Psych: Patient has a history of anxiety.  Denies depression, SI/HI.  No other specific complaints in a complete review of systems (except as listed in HPI above).  Observations/Objective:  Wt Readings from Last 3 Encounters:  08/05/22 134 lb 8 oz (61 kg)  04/05/22 130 lb (59 kg)  04/19/20 135 lb 6.4 oz (61.4 kg)    General: Appears her stated age, well developed, well nourished in NAD. Pulmonary/Chest: Normal effort. No respiratory distress.  Neurological: Alert and oriented.  Psychiatric: Mood and affect normal. Behavior is normal. Judgment and thought content normal.    BMET    Component Value Date/Time   NA 135 04/05/2022 1447   NA 142 04/30/2020 0828   NA 139 11/15/2011 0748   K 3.7 04/05/2022 1447   K 3.7 11/15/2011 0748   CL 107 04/05/2022 1447   CL 107 11/15/2011 0748   CO2 22 04/05/2022 1447   CO2 24 11/15/2011 0748   GLUCOSE 103 (H) 04/05/2022 1447   GLUCOSE 103 (H) 11/15/2011 0748   BUN 26 (H) 04/05/2022 1447   BUN 11 04/30/2020 0828   BUN 10 11/15/2011 0748   CREATININE 0.79 04/05/2022 1447   CREATININE 0.55 (L) 11/15/2011 0748   CALCIUM 8.5 (L) 04/05/2022 1447   CALCIUM 9.1 11/15/2011 0748   GFRNONAA >60 04/05/2022 1447   GFRNONAA >60 11/15/2011 0748   GFRAA >60 02/10/2019 0944   GFRAA >60 11/15/2011 0748    Lipid Panel     Component Value Date/Time   CHOL 122 08/05/2022 0848   TRIG 38 08/05/2022 0848   HDL 61 08/05/2022 0848   CHOLHDL 2.0 08/05/2022 0848   CHOLHDL 2 01/20/2011 1444   VLDL 11.6 01/20/2011 1444   LDLCALC 51 08/05/2022 0848    CBC    Component Value Date/Time   WBC 12.0 (H) 04/05/2022  1447   RBC 4.16 04/05/2022 1447   HGB 12.5 04/05/2022 1447   HGB 12.6 04/30/2020 0828   HCT 38.2 04/05/2022 1447   HCT 38.3 04/30/2020 0828   PLT 239 04/05/2022 1447   PLT 249 04/30/2020 0828   MCV 91.8 04/05/2022 1447   MCV 92 04/30/2020 0828   MCV 87 11/15/2011 0748   MCH 30.0 04/05/2022 1447   MCHC 32.7 04/05/2022 1447   RDW 13.2 04/05/2022 1447   RDW 13.1 04/30/2020 0828   RDW 14.4 11/15/2011 0748   LYMPHSABS 0.7 04/05/2022 1447   LYMPHSABS 0.5 (L) 11/15/2011 0748   MONOABS 1.0  04/05/2022 1447   MONOABS 1.3 (H) 11/15/2011 0748   EOSABS 0.0 04/05/2022 1447   EOSABS 0.0 11/15/2011 0748   BASOSABS 0.1 04/05/2022 1447   BASOSABS 0.1 11/15/2011 0748    Hgb A1C Lab Results  Component Value Date   HGBA1C 5.3 08/05/2022        Assessment and Plan:  RTC in 6 months for annual exam  Follow Up Instructions:    I discussed the assessment and treatment plan with the patient. The patient was provided an opportunity to ask questions and all were answered. The patient agreed with the plan and demonstrated an understanding of the instructions.   The patient was advised to call back or seek an in-person evaluation if the symptoms worsen or if the condition fails to improve as anticipated.    Nicki Reaper, NP

## 2023-01-07 ENCOUNTER — Encounter: Payer: Self-pay | Admitting: Internal Medicine

## 2023-01-07 DIAGNOSIS — F909 Attention-deficit hyperactivity disorder, unspecified type: Secondary | ICD-10-CM | POA: Insufficient documentation

## 2023-01-07 MED ORDER — ATOMOXETINE HCL 10 MG PO CAPS: 10.0000 mg | ORAL_CAPSULE | Freq: Every day | ORAL | 0 refills | Status: DC

## 2023-01-07 MED ORDER — CLONAZEPAM 1 MG PO TABS
ORAL_TABLET | ORAL | 0 refills | Status: DC
Start: 2023-01-07 — End: 2023-03-27

## 2023-01-07 NOTE — Assessment & Plan Note (Signed)
Continue valacyclovir as needed 

## 2023-01-07 NOTE — Patient Instructions (Signed)

## 2023-01-07 NOTE — Assessment & Plan Note (Signed)
Will trial Strattera

## 2023-01-07 NOTE — Assessment & Plan Note (Signed)
Clonazepam refilled today Support offered

## 2023-02-19 DIAGNOSIS — M9902 Segmental and somatic dysfunction of thoracic region: Secondary | ICD-10-CM | POA: Diagnosis not present

## 2023-02-19 DIAGNOSIS — M9901 Segmental and somatic dysfunction of cervical region: Secondary | ICD-10-CM | POA: Diagnosis not present

## 2023-02-19 DIAGNOSIS — Z1329 Encounter for screening for other suspected endocrine disorder: Secondary | ICD-10-CM | POA: Diagnosis not present

## 2023-02-19 DIAGNOSIS — N951 Menopausal and female climacteric states: Secondary | ICD-10-CM | POA: Diagnosis not present

## 2023-02-19 DIAGNOSIS — M542 Cervicalgia: Secondary | ICD-10-CM | POA: Diagnosis not present

## 2023-02-19 DIAGNOSIS — M62830 Muscle spasm of back: Secondary | ICD-10-CM | POA: Diagnosis not present

## 2023-02-24 DIAGNOSIS — Z6820 Body mass index (BMI) 20.0-20.9, adult: Secondary | ICD-10-CM | POA: Diagnosis not present

## 2023-02-24 DIAGNOSIS — N951 Menopausal and female climacteric states: Secondary | ICD-10-CM | POA: Diagnosis not present

## 2023-02-24 DIAGNOSIS — G479 Sleep disorder, unspecified: Secondary | ICD-10-CM | POA: Diagnosis not present

## 2023-02-24 DIAGNOSIS — R232 Flushing: Secondary | ICD-10-CM | POA: Diagnosis not present

## 2023-03-02 ENCOUNTER — Ambulatory Visit
Admission: RE | Admit: 2023-03-02 | Discharge: 2023-03-02 | Disposition: A | Discharge status: Home / Self Care | Payer: BC Managed Care – PPO | Source: Ambulatory Visit | Attending: Obstetrics and Gynecology | Admitting: Obstetrics and Gynecology

## 2023-03-02 DIAGNOSIS — Z1231 Encounter for screening mammogram for malignant neoplasm of breast: Secondary | ICD-10-CM | POA: Insufficient documentation

## 2023-03-23 DIAGNOSIS — M9901 Segmental and somatic dysfunction of cervical region: Secondary | ICD-10-CM | POA: Diagnosis not present

## 2023-03-23 DIAGNOSIS — M62830 Muscle spasm of back: Secondary | ICD-10-CM | POA: Diagnosis not present

## 2023-03-23 DIAGNOSIS — M9902 Segmental and somatic dysfunction of thoracic region: Secondary | ICD-10-CM | POA: Diagnosis not present

## 2023-03-23 DIAGNOSIS — M542 Cervicalgia: Secondary | ICD-10-CM | POA: Diagnosis not present

## 2023-03-26 ENCOUNTER — Ambulatory Visit
Admission: EM | Admit: 2023-03-26 | Discharge: 2023-03-26 | Disposition: A | Discharge status: Home / Self Care | Payer: BC Managed Care – PPO | Attending: Emergency Medicine | Admitting: Emergency Medicine

## 2023-03-26 ENCOUNTER — Encounter: Payer: Self-pay | Admitting: Emergency Medicine

## 2023-03-26 DIAGNOSIS — L03115 Cellulitis of right lower limb: Secondary | ICD-10-CM | POA: Diagnosis not present

## 2023-03-26 DIAGNOSIS — M79661 Pain in right lower leg: Secondary | ICD-10-CM | POA: Diagnosis not present

## 2023-03-26 DIAGNOSIS — M7989 Other specified soft tissue disorders: Secondary | ICD-10-CM | POA: Diagnosis not present

## 2023-03-26 MED ORDER — CEPHALEXIN 500 MG PO CAPS: 500.0000 mg | ORAL_CAPSULE | Freq: Four times a day (QID) | ORAL | 0 refills | Status: AC

## 2023-03-26 NOTE — Discharge Instructions (Addendum)
 We will treat you for cellulitis of right lower shin Rest,ice,elevate right leg Do not scratch,shave or pick at leg May take tylenol  as label directed for pain Recommend DVT of RLE to r/o DVT/muscle injury prior to continued activity,check with PCP for outpt study/order If you develop chest pain, shortness of breath, or worsening symptoms you will need to go to emergency room for further evaluation

## 2023-03-26 NOTE — ED Provider Notes (Signed)
 [facility]    CSN: [id number] Arrival date & time: 03/26/23  1534      History   Chief Complaint Chief Complaint  Patient presents with   Leg Pain    HPI Hannah Mason is a 48 y.o. female.   48 year old female, Hannah Mason, presents to urgent care for evaluation of right leg redness and pain for 4 days.  Patient states she is training for a marathon and ran 20 miles on Sunday.    Past medical history: ADHD, anxiety, anemia, fever blister, headaches  The history is provided by the patient. No language interpreter was used.    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    Past Surgical History:  Procedure Laterality Date   APPENDECTOMY     AUGMENTATION MAMMAPLASTY Bilateral 04/2020   CESAREAN SECTION     1   DILATATION AND CURETTAGE/HYSTEROSCOPY WITH MINERVA N/A 02/14/2019   Procedure: DILATATION AND CURETTAGE/HYSTEROSCOPY WITH MINERVA;  Surgeon: Connell Davies, MD;  Location: [facility];  Service: Gynecology;  Laterality: N/A;   DILATION AND CURETTAGE OF UTERUS     DNC  2011   Miscarrigage in 2011   IUD REMOVAL N/A 02/14/2019   Procedure: INTRAUTERINE DEVICE (IUD) REMOVAL;  Surgeon: Connell Davies, MD;  Location: [facility];  Service: Gynecology;  Laterality: N/A;   TUBAL LIGATION     TUBAL LIGATION      OB History     Gravida  3   Para  2   Term  2   Preterm      AB  1   Living  2      SAB  1   IAB      Ectopic      Multiple      Live Births  2            Home Medications    Prior to Admission medications   Medication Sig  Start Date End Date Taking? Authorizing Provider  cephALEXin  (KEFLEX ) 500 MG capsule Take 1 capsule (500 mg total) by mouth 4 (four) times daily for 7 days. 03/26/23 04/02/23 Yes Skye Plamondon, Rilla, NP  clonazePAM  (KLONOPIN ) 1 MG tablet TAKE 1 TABLET(1 MG) BY MOUTH DAILY AS NEEDED FOR ANXIETY 01/07/23   Antonette Angeline ORN, NP  valACYclovir  (VALTREX ) 1000 MG tablet TAKE 1 TABLET(1000 MG) BY MOUTH DAILY AS NEEDED FOR OUTBREAK 10/17/22   Antonette Angeline ORN, NP    Family History Family History  Problem Relation Age of Onset   Diabetes Father    Hypertension Father    Dementia Mother    Breast cancer Neg Hx    Ovarian cancer Neg Hx    Colon cancer Neg Hx    Heart disease Neg Hx     Social History Social History   Tobacco Use   Smoking status: Former    Current packs/day: 0.00    Types: Cigarettes    Quit date: 12/17/2005    Years since quitting:  17.2   Smokeless tobacco: Never  Vaping Use   Vaping status: Never Used  Substance Use Topics   Alcohol use: Yes    Comment: social   Drug use: No     Allergies   Patient has no known allergies.   Review of Systems Review of Systems  Constitutional:  Negative for fever.  Respiratory:  Negative for shortness of breath.   Cardiovascular:  Negative for chest pain and palpitations.  Skin:  Positive for color change and wound. Negative for pallor and rash.  All other systems reviewed and are negative.    Physical Exam Triage Vital Signs ED Triage Vitals  Encounter Vitals Group     BP 03/26/23 1614 105/67     Systolic BP Percentile --      Diastolic BP Percentile --      Pulse Rate 03/26/23 1614 62     Resp 03/26/23 1614 18     Temp 03/26/23 1614 98.5 F (36.9 C)     Temp Source 03/26/23 1614 Oral     SpO2 03/26/23 1614 98 %     Weight --      Height --      Head Circumference --      Peak Flow --      Pain Score 03/26/23 1612 4     Pain Loc --      Pain Education --      Exclude from Growth Chart --    No data  found.  Updated Vital Signs BP 105/67 (BP Location: Left Arm)   Pulse 62   Temp 98.5 F (36.9 C) (Oral)   Resp 18   SpO2 98%   Visual Acuity Right Eye Distance:   Left Eye Distance:   Bilateral Distance:    Right Eye Near:   Left Eye Near:    Bilateral Near:     Physical Exam Vitals and nursing note reviewed.  Musculoskeletal:     Right lower leg: Swelling and tenderness present.       Legs:     Comments: Patient has small scabbed area to right lower ankle anterior aspect.  Patient with tenderness, redness swelling, 4 out of 10 pain right anterior shin, no rash no streaking no purulent drainage  Skin:    General: Skin is warm.     Capillary Refill: Capillary refill takes less than 2 seconds.     Findings: Abrasion, erythema and wound present.  Neurological:     General: No focal deficit present.     Mental Status: She is alert and oriented to person, place, and time.     GCS: GCS eye subscore is 4. GCS verbal subscore is 5. GCS motor subscore is 6.     Cranial Nerves: No cranial nerve deficit.     Sensory: No sensory deficit.  Psychiatric:        Attention and Perception: Attention normal.        Mood and Affect: Mood normal.        Speech: Speech normal.      UC Treatments / Results  Labs (all labs ordered are listed, but only abnormal results are displayed) Labs Reviewed - No data to display  EKG   Radiology No results found.  Procedures Procedures (including critical care time)  Medications Ordered in UC Medications - No data to display  Initial Impression / Assessment and Plan / UC Course  I have reviewed the triage vital signs and the nursing notes.  Pertinent  labs & imaging results that were available during my care of the patient were reviewed by me and considered in my medical decision making (see chart for details).  Clinical Course as of 03/26/23 1834  Thu Mar 26, 2023  1639 Ddx: Cellulitis, DVT,muscle tear,pt does not have a fever or  tachycardia, discussed US  of RLE to r/o DVT/muscle tear before further marathon training [JD]    Clinical Course User Index [JD] Colsen Modi, Rilla, NP   Discussed exam findings and plan of care with patient, strict go to ER precautions given.   Patient verbalized understanding to this provider.  Ddx: Cellulitis, leg pain/ swelling, muscle tear ,DVT Final Clinical Impressions(s) / UC Diagnoses   Final diagnoses:  Pain and swelling of right lower leg  Cellulitis of right lower extremity     Discharge Instructions      We will treat you for cellulitis of right lower shin Rest,ice,elevate right leg Do not scratch,shave or pick at leg May take tylenol  as label directed for pain Recommend DVT of RLE to r/o DVT/muscle injury prior to continued activity,check with PCP for outpt study/order If you develop chest pain, shortness of breath, or worsening symptoms you will need to go to emergency room for further evaluation     ED Prescriptions     Medication Sig Dispense Auth. Provider   cephALEXin  (KEFLEX ) 500 MG capsule Take 1 capsule (500 mg total) by mouth 4 (four) times daily for 7 days. 28 capsule Nelson Julson, NP      PDMP not reviewed this encounter.   Aminta Rilla, NP 03/26/23 [phone]

## 2023-03-26 NOTE — ED Triage Notes (Signed)
 Patient c/o right leg redness and discomfort x 4 days. Patient states she is training for a marathon and ran 20 miles on Sunday.

## 2023-03-27 ENCOUNTER — Ambulatory Visit: Payer: BC Managed Care – PPO | Admitting: Internal Medicine

## 2023-03-27 ENCOUNTER — Encounter: Payer: Self-pay | Admitting: Internal Medicine

## 2023-03-27 VITALS — BP 98/64 | HR 62 | Ht 67.0 in | Wt 134.0 lb

## 2023-03-27 DIAGNOSIS — M7989 Other specified soft tissue disorders: Secondary | ICD-10-CM | POA: Diagnosis not present

## 2023-03-27 DIAGNOSIS — R238 Other skin changes: Secondary | ICD-10-CM

## 2023-03-27 MED ORDER — VALACYCLOVIR HCL 1 G PO TABS: ORAL_TABLET | ORAL | 1 refills | Status: AC

## 2023-03-27 MED ORDER — CLONAZEPAM 1 MG PO TABS: ORAL_TABLET | ORAL | 0 refills | Status: DC

## 2023-03-27 NOTE — Progress Notes (Signed)
 Subjective:    Patient ID: Hannah Mason, female    DOB: 1975/11/30, 48 y.o.   MRN: [id number]  HPI  Discussed the use of AI scribe software for clinical note transcription with the patient, who gave verbal consent to proceed.   Hannah Mason is a 48 year old female who presents with redness and swelling in her right leg.  Redness and swelling in her leg began suddenly on Sunday night. She has been training for a marathon but did not run on Sunday, as it is her rest day, but had completed a 20-mile run on Saturday without any injury or discomfort. She mentioned a small scratch on the front of her right ankle but was unsure if it was related.  A telehealth consultation suggested possible cellulitis, and she was advised to visit urgent care for further evaluation. At urgent care, she was prescribed Keflex , which she started taking last night, with a dose of one pill last night and one this morning. She also took four 81 mg aspirin tablets. The redness was initially significant but has improved since Sunday, with swelling localized to a specific area of the leg. No pain or swelling in the back of the leg and no circumferential redness.  She is currently training for a full marathon in 16 days, with her longest run being 20 miles. She plans to rest from running for a few days and keep her leg elevated.  No family history of clotting disorders or diseases.       Review of Systems   Past Medical History:  Diagnosis Date   ADHD    Allergy    seasonal   Anemia    vitamin d  deficiency   Anxiety    COVID-19    Fever blister    Headache(784.0)    frequent   Scoliosis     Current Outpatient Medications  Medication Sig Dispense Refill   cephALEXin  (KEFLEX ) 500 MG capsule Take 1 capsule (500 mg total) by mouth 4 (four) times daily for 7 days. 28 capsule 0   clonazePAM  (KLONOPIN ) 1 MG tablet TAKE 1 TABLET(1 MG) BY MOUTH DAILY AS NEEDED FOR ANXIETY 20 tablet 0   valACYclovir  (VALTREX )  1000 MG tablet TAKE 1 TABLET(1000 MG) BY MOUTH DAILY AS NEEDED FOR OUTBREAK 30 tablet 1   No current facility-administered medications for this visit.    No Known Allergies  Family History  Problem Relation Age of Onset   Diabetes Father    Hypertension Father    Dementia Mother    Breast cancer Neg Hx    Ovarian cancer Neg Hx    Colon cancer Neg Hx    Heart disease Neg Hx     Social History   Socioeconomic History   Marital status: Married    Spouse name: Brad   Number of children: 4   Years of education: Not on file   Highest education level: Not on file  Occupational History   Occupation: geologist, engineering  Tobacco Use   Smoking status: Former    Current packs/day: 0.00    Types: Cigarettes    Quit date: 12/17/2005    Years since quitting: 17.2   Smokeless tobacco: Never  Vaping Use   Vaping status: Never Used  Substance and Sexual Activity   Alcohol use: Yes    Comment: social   Drug use: No   Sexual activity: Yes    Comment: iud to be removed 02/14/19  Other Topics Concern  Not on file  Social History Narrative   Lives with husband in [place], 4 children in home (14,12,5,77months). No pets. Work ENGINEER, [employer].   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   Social Drivers of Health   Financial Resource Strain: Not on file  Food Insecurity: Not on file  Transportation Needs: Not on file  Physical Activity: Sufficiently Active (12/24/2016)   Exercise Vital Sign    Days of Exercise per Week: 6 days    Minutes of Exercise per Session: 60 min  Stress: No Stress Concern Present (12/24/2016)   [employer] of Occupational Health - Occupational Stress Questionnaire    Feeling of Stress : Not at all  Social Connections: Not on file  Intimate Partner Violence: Not At Risk (12/24/2016)   Humiliation, Afraid, Rape, and Kick questionnaire    Fear of Current or Ex-Partner: No    Emotionally Abused: No    Physically Abused: No    Sexually Abused: No     Constitutional: Denies  fever, malaise, fatigue, headache or abrupt weight changes.  Respiratory: Denies difficulty breathing, shortness of breath, cough or sputum production.   Cardiovascular: Denies chest pain, chest tightness, palpitations or swelling in the hands or feet.  Gastrointestinal: Denies abdominal pain, bloating, constipation, diarrhea or blood in the stool.  Musculoskeletal: Patient reports swelling of right leg.  Denies decrease in range of motion, difficulty with gait, muscle pain or joint pain.  Skin: Patient reports redness of right leg.  Denies rashes, lesions or ulcercations.  Neurological: Denies numbness, tingling, weakness or problems with balance and coordination.    No other specific complaints in a complete review of systems (except as listed in HPI above).      Objective:   Physical Exam  BP 98/64 (BP Location: Left Arm, Patient Position: Sitting, Cuff Size: Normal)   Pulse 62   Ht 5' 7 (1.702 m)   Wt 134 lb (60.8 kg)   SpO2 99%   BMI 20.99 kg/m   Wt Readings from Last 3 Encounters:  08/05/22 134 lb 8 oz (61 kg)  04/05/22 130 lb (59 kg)  04/19/20 135 lb 6.4 oz (61.4 kg)    General: Appears her stated age, well developed, well nourished in NAD. Skin: Warm, dry and intact.  She has a small scab over the anterior right ankle.  No redness or rashes noted. Cardiovascular: Normal rate and rhythm.  Pedal pulse 2+ on the right.  Negative Homans' sign. Pulmonary/Chest: Normal effort and positive vesicular breath sounds. No respiratory distress. No wheezes, rales or ronchi noted.  Musculoskeletal: Normal flexion and extension of the right knee.  Normal flexion, extension and rotation of the right ankle.  No joint swelling noted.  She has localized swelling noted over the mid tibia.  No difficulty with gait.  Neurological: Alert and oriented.    BMET    Component Value Date/Time   NA 135 04/05/2022 1447   NA 142 04/30/2020 0828   NA 139 11/15/2011 0748   K 3.7 04/05/2022 1447    K 3.7 11/15/2011 0748   CL 107 04/05/2022 1447   CL 107 11/15/2011 0748   CO2 22 04/05/2022 1447   CO2 24 11/15/2011 0748   GLUCOSE 103 (H) 04/05/2022 1447   GLUCOSE 103 (H) 11/15/2011 0748   BUN 26 (H) 04/05/2022 1447   BUN 11 04/30/2020 0828   BUN 10 11/15/2011 0748   CREATININE 0.79 04/05/2022 1447   CREATININE 0.55 (L) 11/15/2011 0748   CALCIUM 8.5 (L) 04/05/2022 1447  CALCIUM 9.1 11/15/2011 0748   GFRNONAA >60 04/05/2022 1447   GFRNONAA >60 11/15/2011 0748   GFRAA >60 02/10/2019 0944   GFRAA >60 11/15/2011 0748    Lipid Panel     Component Value Date/Time   CHOL 122 08/05/2022 0848   TRIG 38 08/05/2022 0848   HDL 61 08/05/2022 0848   CHOLHDL 2.0 08/05/2022 0848   CHOLHDL 2 01/20/2011 1444   VLDL 11.6 01/20/2011 1444   LDLCALC 51 08/05/2022 0848    CBC    Component Value Date/Time   WBC 12.0 (H) 04/05/2022 1447   RBC 4.16 04/05/2022 1447   HGB 12.5 04/05/2022 1447   HGB 12.6 04/30/2020 0828   HCT 38.2 04/05/2022 1447   HCT 38.3 04/30/2020 0828   PLT 239 04/05/2022 1447   PLT 249 04/30/2020 0828   MCV 91.8 04/05/2022 1447   MCV 92 04/30/2020 0828   MCV 87 11/15/2011 0748   MCH 30.0 04/05/2022 1447   MCHC 32.7 04/05/2022 1447   RDW 13.2 04/05/2022 1447   RDW 13.1 04/30/2020 0828   RDW 14.4 11/15/2011 0748   LYMPHSABS 0.7 04/05/2022 1447   LYMPHSABS 0.5 (L) 11/15/2011 0748   MONOABS 1.0 04/05/2022 1447   MONOABS 1.3 (H) 11/15/2011 0748   EOSABS 0.0 04/05/2022 1447   EOSABS 0.0 11/15/2011 0748   BASOSABS 0.1 04/05/2022 1447   BASOSABS 0.1 11/15/2011 0748    Hgb A1C Lab Results  Component Value Date   HGBA1C 5.3 08/05/2022            Assessment & Plan:  Assessment and Plan    Lower Extremity Redness and Swelling Sudden onset of localized redness and swelling in the leg, likely due to cellulitis or superficial thrombophlebitis. No signs of deep vein thrombosis (DVT) based on clinical examination. -Continue Keflex  three times a day for seven  days. -Continue aspirin 81mg  daily for one week. -Elevate leg when sitting. -Observe and report if symptoms worsen.   RTC in 4 months for your annual exam Angeline Laura, NP

## 2023-03-27 NOTE — Patient Instructions (Signed)
Thrombophlebitis  Thrombophlebitis is a condition in which a blood clot and inflammation occur inside a vein. This can happen in the arms, legs, or torso. Thrombophlebitis may involve superficial veins, deep veins, or both. Superficial veins are close to the surface of the body and are part of the superficial venous system. Veins that are deeper inside the body are part of the deep venous system. When this condition happens in a superficial vein (superficial thrombophlebitis), it is usually not serious.However, when the condition happens in a vein that is part of the deep venous system (deep vein thrombosis, DVT), it can cause serious problems. What are the causes? This condition may be caused by: Infection, injury, or trauma to a vein. Inflammation of the veins. Medical conditions that can cause blood to clot more easily (hypercoagulable state). Backing up, or reflux, of blood flow through the veins (chronic venous insufficiency or venous stasis). What increases the risk? The following factors may make you more likely to develop this condition: Having a long, thin tube (catheter) put in a vein, such as a central line, port, or IV catheter. Getting certain medicines through a catheter that can irritate the vein. Pregnancy or having recently given birth. Cancer. Obesity. Taking oral contraceptive pills (OCPs) or hormone therapy (HT) medicines. Spasms of veins. Immobilization, or not moving the limbs for prolonged periods. What are the signs or symptoms? The main symptoms of this condition are: Swelling and pain in an arm or leg. If the affected vein is in the leg, you may feel pain while standing or walking. Warmth or redness in an arm or leg. Tenderness in the affected area when it is touched. Other symptoms include: Low-grade fever. Muscle aches. A bulging or hard vein (venous distension). In some cases, there are no symptoms. How is this diagnosed? This condition may be diagnosed  based on: Your symptoms and medical history. A physical exam. Tests, such as a test that uses sound waves to make images (duplex ultrasound). How is this treated? Treatment depends on how severe the condition is and which area of the body is affected. Treatment may include: Applying a warm compress or heating pad to affected areas. This may need to be repeated several times a day. Moving the affected limb. For example, you may need to start doing walking exercises right away. You will also be encouraged to continue your usual daily activities. Raising (elevating) the affected arm or leg above the level of your heart. Medicines, such as: Anti-inflammatory medicines, such as ibuprofen. Blood thinners (anticoagulants) or anti-platelet drugs such as aspirin. Removing an IV or central line that may be causing the problem. Wearing compression stockings to help prevent blood clots and reduce swelling in your legs. Follow these instructions at home: Medicines Take over-the-counter and prescription medicines only as told by your health care provider. If you are taking blood thinners: Talk with your health care provider before you take any medicines that contain aspirin or NSAIDs, such as ibuprofen. These medicines increase your risk for dangerous bleeding. Take your medicine exactly as told, at the same time every day. Avoid activities that could cause injury or bruising, and follow instructions about how to prevent falls. Wear a medical alert bracelet or carry a card that lists what medicines you take. Managing pain, stiffness, and swelling  If directed, apply heat to the affected area as often as told by your health care provider. Use the heat source that your health care provider recommends, such as a moist heat pack  or a heating pad. Place a towel between your skin and the heat source. Leave the heat on for 20-30 minutes. Remove the heat if your skin turns bright red. This is especially  important if you are unable to feel pain, heat, or cold. You have a greater risk of getting burned. Elevate the affected area above the level of your heart while you are sitting or lying down. Activity Return to your normal activities as told by your health care provider. Ask your health care provider what activities are safe for you. Avoid sitting for a long time without moving. Get up to take short walks every 1-2 hours. This is important to improve blood flow and breathing. Ask for help if you feel weak or unsteady. Do exercises as told by your health care provider. Rest as told by your health care provider. General instructions Drink enough fluid to keep your urine pale yellow. Wear compression stockings as told by your health care provider. Do not use any products that contain nicotine or tobacco. These products include cigarettes, chewing tobacco, and vaping devices, such as e-cigarettes. If you need help quitting, ask your health care provider. Keep all follow-up visits. This is important. Contact a health care provider if: You miss a dose of your blood thinner, if applicable. Your symptoms do not improve. You have unusual bruising. You have nausea, vomiting, or diarrhea that lasts for more than a day. Get help right away if: You are breathing fast or have chest pain. You have blood in your vomit, urine, or stool. You have severe pain in your affected arm or leg or new pain in any arm or leg. You have light-headedness, dizziness, a severe headache, or confusion. These symptoms may represent a serious problem that is an emergency. Do not wait to see if the symptoms will go away. Get medical help right away. Call your local emergency services (911 in the [place] Do not drive yourself to the hospital. Summary Thrombophlebitis is a condition in which a blood clot forms in a vein, causing inflammation and often pain. This can happen in both superficial and deep veins. The main symptom of  this condition is swelling and pain around the affected vein. Tenderness and redness may also be present. Treatment may include warm compresses, compression stockings, anti-inflammatory medicines, or blood thinners. Make sure you take all medicines, especially blood thinners, as instructed and keep all follow-up visits to ensure proper healing of the vein. This information is not intended to replace advice given to you by your health care provider. Make sure you discuss any questions you have with your health care provider. Document Revised: 07/30/2020 Document Reviewed: 07/30/2020 Elsevier Patient Education  2024 [employer].

## 2023-04-14 DIAGNOSIS — M542 Cervicalgia: Secondary | ICD-10-CM | POA: Diagnosis not present

## 2023-04-14 DIAGNOSIS — M9901 Segmental and somatic dysfunction of cervical region: Secondary | ICD-10-CM | POA: Diagnosis not present

## 2023-04-14 DIAGNOSIS — M62830 Muscle spasm of back: Secondary | ICD-10-CM | POA: Diagnosis not present

## 2023-04-14 DIAGNOSIS — M9902 Segmental and somatic dysfunction of thoracic region: Secondary | ICD-10-CM | POA: Diagnosis not present

## 2023-06-04 ENCOUNTER — Other Ambulatory Visit: Payer: Self-pay | Admitting: Internal Medicine

## 2023-06-05 NOTE — Telephone Encounter (Signed)
 Requested medication (s) are due for refill today -yes  Requested medication (s) are on the active medication list -yes  Future visit scheduled -no  Last refill: 03/27/23 #20  Notes to clinic: non delegated Rx  Requested Prescriptions  Pending Prescriptions Disp Refills   clonazePAM  (KLONOPIN ) 1 MG tablet [Pharmacy Med Name: CLONAZEPAM  1MG  TABLETS] 20 tablet     Sig: TAKE 1 TABLET(1 MG) BY MOUTH DAILY AS NEEDED FOR ANXIETY     Not Delegated - Psychiatry: Anxiolytics/Hypnotics 2 Failed - 06/05/2023  8:27 AM      Failed - This refill cannot be delegated      Failed - Urine Drug Screen completed in last 360 days      Passed - Patient is not pregnant      Passed - Valid encounter within last 6 months    Recent Outpatient Visits           2 months ago Redness and swelling of lower leg   [facility] [place], Rankin Buzzard, NP                 Requested Prescriptions  Pending Prescriptions Disp Refills   clonazePAM  (KLONOPIN ) 1 MG tablet [Pharmacy Med Name: CLONAZEPAM  1MG  TABLETS] 20 tablet     Sig: TAKE 1 TABLET(1 MG) BY MOUTH DAILY AS NEEDED FOR ANXIETY     Not Delegated - Psychiatry: Anxiolytics/Hypnotics 2 Failed - 06/05/2023  8:27 AM      Failed - This refill cannot be delegated      Failed - Urine Drug Screen completed in last 360 days      Passed - Patient is not pregnant      Passed - Valid encounter within last 6 months    Recent Outpatient Visits           2 months ago Redness and swelling of lower leg   [facility] [place], Rankin Buzzard, [place]

## 2023-08-17 DIAGNOSIS — M79671 Pain in right foot: Secondary | ICD-10-CM | POA: Diagnosis not present

## 2023-08-17 DIAGNOSIS — M722 Plantar fascial fibromatosis: Secondary | ICD-10-CM | POA: Diagnosis not present

## 2023-09-22 DIAGNOSIS — M9902 Segmental and somatic dysfunction of thoracic region: Secondary | ICD-10-CM | POA: Diagnosis not present

## 2023-09-22 DIAGNOSIS — M9901 Segmental and somatic dysfunction of cervical region: Secondary | ICD-10-CM | POA: Diagnosis not present

## 2023-09-22 DIAGNOSIS — M542 Cervicalgia: Secondary | ICD-10-CM | POA: Diagnosis not present

## 2023-09-22 DIAGNOSIS — M62830 Muscle spasm of back: Secondary | ICD-10-CM | POA: Diagnosis not present

## 2023-10-20 DIAGNOSIS — M9901 Segmental and somatic dysfunction of cervical region: Secondary | ICD-10-CM | POA: Diagnosis not present

## 2023-10-20 DIAGNOSIS — M62830 Muscle spasm of back: Secondary | ICD-10-CM | POA: Diagnosis not present

## 2023-10-20 DIAGNOSIS — M9902 Segmental and somatic dysfunction of thoracic region: Secondary | ICD-10-CM | POA: Diagnosis not present

## 2023-10-20 DIAGNOSIS — M542 Cervicalgia: Secondary | ICD-10-CM | POA: Diagnosis not present
